# Patient Record
Sex: Female | Born: 1950 | Race: Black or African American | Hispanic: No | Smoking: Former smoker
Health system: Southern US, Community
[De-identification: ages and names within clinical notes are randomized; demographics above are authoritative.]

## PROBLEM LIST (undated history)

## (undated) DIAGNOSIS — J449 Chronic obstructive pulmonary disease, unspecified: Secondary | ICD-10-CM

## (undated) DIAGNOSIS — I2699 Other pulmonary embolism without acute cor pulmonale: Secondary | ICD-10-CM

## (undated) DIAGNOSIS — C801 Malignant (primary) neoplasm, unspecified: Secondary | ICD-10-CM

## (undated) HISTORY — PX: BREAST EXCISIONAL BIOPSY: SUR124

## (undated) HISTORY — DX: Malignant (primary) neoplasm, unspecified: C80.1

## (undated) HISTORY — DX: Other pulmonary embolism without acute cor pulmonale: I26.99

---

## 2015-09-22 DIAGNOSIS — E785 Hyperlipidemia, unspecified: Secondary | ICD-10-CM | POA: Insufficient documentation

## 2017-02-06 DIAGNOSIS — M810 Age-related osteoporosis without current pathological fracture: Secondary | ICD-10-CM | POA: Insufficient documentation

## 2017-12-05 ENCOUNTER — Encounter: Payer: Self-pay | Admitting: Physical Therapy

## 2017-12-05 ENCOUNTER — Other Ambulatory Visit: Payer: Self-pay

## 2017-12-05 ENCOUNTER — Ambulatory Visit: Payer: Medicare Other | Attending: Family Medicine | Admitting: Physical Therapy

## 2017-12-05 DIAGNOSIS — M546 Pain in thoracic spine: Secondary | ICD-10-CM

## 2017-12-05 DIAGNOSIS — M545 Low back pain, unspecified: Secondary | ICD-10-CM

## 2017-12-05 DIAGNOSIS — M6281 Muscle weakness (generalized): Secondary | ICD-10-CM

## 2017-12-05 DIAGNOSIS — M25512 Pain in left shoulder: Secondary | ICD-10-CM

## 2017-12-05 DIAGNOSIS — M25511 Pain in right shoulder: Secondary | ICD-10-CM | POA: Diagnosis present

## 2017-12-05 NOTE — Patient Instructions (Addendum)
Angry Cat, All Fours    Kneel on hands and knees. Tuck chin and tighten stomach. Exhale and round back upward. Inhale and arch back downward. Hold each position _1__ seconds. Repeat _10__ times per session. Do _1__ sessions per day.  Copyright  VHI. All rights reserved.  BACK: Child's Pose (Sciatica)    Sit in knee-chest position and reach arms forward. Separate knees for comfort. Hold position for _30__ breaths. Repeat __2_ times. Do _1__ times per day.  Copyright  VHI. All rights reserved.  Mid-Back Rotation Stretch    Reach to each side as far as possible, keeping chest low to floor. Hold _30___ seconds. Repeat __2__ times per set. Do __1__ sets per session. Do __1__ sessions per day.  http://orth.exer.us/132   Copyright  VHI. All rights reserved.  Lumbar Rotation (Sitting)    Arms crossed, gently rotate trunk from side to side in a small, pain-free range of motion. Repeat __4__ times per set. Do __1__ sets per session. Do __1__ sessions per day.  http://orth.exer.us/164   Copyright  VHI. All rights reserved.  [facility] [address], [place], [place] [address] Phone # [phone] Fax [phone]
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
## 2017-12-06 ENCOUNTER — Ambulatory Visit: Payer: Medicare Other | Admitting: Physical Therapy

## 2017-12-06 DIAGNOSIS — M545 Low back pain, unspecified: Secondary | ICD-10-CM

## 2017-12-06 DIAGNOSIS — M6281 Muscle weakness (generalized): Secondary | ICD-10-CM

## 2017-12-06 DIAGNOSIS — M546 Pain in thoracic spine: Secondary | ICD-10-CM | POA: Diagnosis not present

## 2017-12-06 DIAGNOSIS — M25511 Pain in right shoulder: Secondary | ICD-10-CM

## 2017-12-06 DIAGNOSIS — M25512 Pain in left shoulder: Secondary | ICD-10-CM

## 2017-12-06 NOTE — Patient Instructions (Addendum)
Copyright  VHI. All rights reserved.  Strengthening: Shoulder Shrug (Phase 1)   Shrug shoulders up and down, forward and backward. Repeat __10__ times per set. Do __1__ sets per session. Do _3___ sessions per day.  http://orth.exer.us/336   Copyright  VHI. All rights reserved.          TENS UNIT  This is helpful for muscle pain and spasm.   Search and Purchase a TENS 7000 2nd edition at www.tenspros.com or www.amazon.com  (It should be less than $30)     TENS unit instructions:   Do not shower or bathe with the unit on  Turn the unit off before removing electrodes or batteries  If the electrodes lose stickiness add a drop of water to the electrodes after they are disconnected from the unit and place on plastic sheet. If you continued to have difficulty, call the TENS unit company to purchase more electrodes.  Do not apply lotion on the skin area prior to use. Make sure the skin is clean and dry as this will help prolong the life of the electrodes.  After use, always check skin for unusual red areas, rash or other skin difficulties. If there are any skin problems, does not apply electrodes to the same area.  Never remove the electrodes from the unit by pulling the wires.  Do not use the TENS unit or electrodes other than as directed.  Do not change electrode placement without consulting your therapist or physician.  Keep 2 fingers with between each electrode.   TENS stands for Transcutaneous Electrical Nerve Stimulation. In other words, electrical impulses are allowed to pass through the skin in order to excite a nerve.   Purpose and Use of TENS:  TENS is a method used to manage acute and chronic pain without the use of drugs. It has been effective in managing pain associated with surgery, sprains, strains, trauma, rheumatoid arthritis, and neuralgias. It is a non-addictive, low risk, and non-invasive technique used to control pain. It is not,  by any means, a curative form of treatment.   How TENS Works:  Most TENS units are a Paramedic unit powered by one 9 volt battery. Attached to the outside of the unit are two lead wires where two pins and/or snaps connect on each wire. All units come with a set of four reusable pads or electrodes. These are placed on the skin surrounding the area involved. By inserting the leads into  the pads, the electricity can pass from the unit making the circuit complete.  As the intensity is turned up slowly, the electrical current enters the body from the electrodes through the skin to the surrounding nerve fibers. This triggers the release of hormones from within the body. These hormones contain pain relievers. By increasing the circulation of these hormones, the person's pain may be lessened. It is also believed that the electrical stimulation itself helps to block the pain messages being sent to the brain, thus also decreasing the body's perception of pain.   Hazards:  TENS units are NOT to be used by patients with PACEMAKERS, DEFIBRILLATORS, DIABETIC PUMPS, PREGNANT WOMEN, and patients with SEIZURE DISORDERS.  TENS units are NOT to be used over the heart, throat, brain, or spinal cord.  One of the major side effects from the TENS unit may be skin irritation. Some people may develop a rash if they are sensitive to the materials used in the electrodes  or the connecting wires.     Avoid overuse due the body getting used to the stem making it not as effective over time.  Ruben Im PT [facility] [address], [place], [place] [address] Phone # [phone] Fax [phone]
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
## 2017-12-25 ENCOUNTER — Ambulatory Visit: Payer: Medicare Other | Attending: Family Medicine | Admitting: Physical Therapy

## 2017-12-25 ENCOUNTER — Encounter: Payer: Self-pay | Admitting: Physical Therapy

## 2017-12-25 DIAGNOSIS — M546 Pain in thoracic spine: Secondary | ICD-10-CM | POA: Insufficient documentation

## 2017-12-25 DIAGNOSIS — M25511 Pain in right shoulder: Secondary | ICD-10-CM | POA: Diagnosis present

## 2017-12-25 DIAGNOSIS — M25512 Pain in left shoulder: Secondary | ICD-10-CM | POA: Diagnosis present

## 2017-12-25 DIAGNOSIS — M545 Low back pain, unspecified: Secondary | ICD-10-CM

## 2017-12-25 DIAGNOSIS — M6281 Muscle weakness (generalized): Secondary | ICD-10-CM

## 2017-12-25 NOTE — Patient Instructions (Addendum)
Isometric Hold (Hook-Lying)    Lie with hips and knees bent. Slowly inhale, and then exhale. Pull navel toward spine and Hold for _5__ seconds. Continue to breathe in and out during hold. Rest for __5_ seconds. Repeat _10__ times. Do _1__ times a day.   Copyright  VHI. All rights reserved.    Shoulder Press    Press both shoulders down. Hold _5 seconds. Repeat _10__ times.  Do other shoulder. If unable to press one or both shoulders, lie in position a few sessions until you can. Surface: floor   Copyright  VHI. All rights reserved.    In a side lying position have bottom leg straight and top knee bent as pictured. Rotation shoulders back to feel a stretch in the lumbar spine. Hold down on top knee as shown. Hold 30 seconds 2 time each way.   [facility] [address], [place], [place] [address] Phone # [phone] Fax [phone]
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
## 2017-12-27 ENCOUNTER — Ambulatory Visit: Payer: Medicare Other | Admitting: Physical Therapy

## 2017-12-27 DIAGNOSIS — M545 Low back pain, unspecified: Secondary | ICD-10-CM

## 2017-12-27 DIAGNOSIS — M6281 Muscle weakness (generalized): Secondary | ICD-10-CM

## 2017-12-27 DIAGNOSIS — M546 Pain in thoracic spine: Secondary | ICD-10-CM

## 2017-12-27 DIAGNOSIS — M25511 Pain in right shoulder: Secondary | ICD-10-CM

## 2017-12-27 DIAGNOSIS — M25512 Pain in left shoulder: Secondary | ICD-10-CM
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
## 2017-12-27 NOTE — Patient Instructions (Signed)
    RE-ALIGNMENT ROUTINE EXERCISES BASIC FOR POSTURAL CORRECTION   RE-ALIGNMENT Tips BENEFITS: 1.It helps to re-align the curves of the back and improve standing posture. 2.It allows the back muscles to rest and strengthen in preparation for more activity. FREQUENCY: Daily, even after weeks, months and years of more advanced exercises. START: 1.All exercises start in the same position: lying on the back, arms resting on the supporting surface, palms up and slightly away from the body, backs of hands down, knees bent, feet flat. 2.The head, neck, arms, and legs are supported according to specific instructions of your therapist. Copyright  VHI. All rights reserved.    1. Decompression Exercise: Basic.   Takes compression off the vertebral bodies; increases tolerance for lying on the back; helps relieve back pain   Lie on back on firm surface, knees bent, feet flat, arms turned up, out to sides (~35 degrees). Head neck and arms supported as necessary. Time _5-15__ minutes. Surface: floor     2. Shoulder Press  Strengthens upper back extensors and scapular retractors.   Press both shoulders down. Hold _2-3__ seconds. Repeat _3-5__ times. Surface: floor        3. Head Press With [place]  Strengthens neck extensors   Tuck chin SLIGHTLY toward chest, keep mouth closed. Feel weight on back of head. Increase weight by pressing head down. Hold _2-3__ seconds. Relax. Repeat 3-5___ times. Surface: floor   4. Leg Lengthener: Make your leg grow longer 5 sec hold, 5x   5. Whole leg push down "into the sand" 5 sec hold, 5x      Ruben Im PT [facility] [address] [place], [place] [address] Phone # [phone] Fax [phone]

## 2018-01-01 ENCOUNTER — Encounter: Payer: Self-pay | Admitting: Physical Therapy

## 2018-01-01 ENCOUNTER — Ambulatory Visit: Payer: Medicare Other | Admitting: Physical Therapy

## 2018-01-01 DIAGNOSIS — M546 Pain in thoracic spine: Secondary | ICD-10-CM | POA: Diagnosis not present

## 2018-01-01 DIAGNOSIS — M545 Low back pain, unspecified: Secondary | ICD-10-CM

## 2018-01-01 DIAGNOSIS — M25511 Pain in right shoulder: Secondary | ICD-10-CM

## 2018-01-01 DIAGNOSIS — M6281 Muscle weakness (generalized): Secondary | ICD-10-CM

## 2018-01-01 DIAGNOSIS — M25512 Pain in left shoulder: Secondary | ICD-10-CM
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
## 2018-01-01 NOTE — Patient Instructions (Signed)
   Over Head Pull: Narrow Grip       On back, knees bent, feet flat, band across thighs, elbows straight but relaxed. Pull hands apart (start). Keeping elbows straight, bring arms up and over head, hands toward floor. Keep pull steady on band. Hold momentarily. Return slowly, keeping pull steady, back to start. Repeat _5-8__ times. Band color ___yellow___   Side Pull: Double Arm   On back, knees bent, feet flat. Arms perpendicular to body, shoulder level, elbows straight but relaxed. Pull arms out to sides, elbows straight. Resistance band comes across collarbones, hands toward floor. Hold momentarily. Slowly return to starting position. Repeat _5-8__ times. Band color _yellow____   Sash   On back, knees bent, feet flat, left hand on left hip, right hand above left. Pull right arm DIAGONALLY (hip to shoulder) across chest. Bring right arm along head toward floor. Hold momentarily. Slowly return to starting position. Repeat __5-8_ times. Do with left arm. Band color ____yellow __   Shoulder Rotation: Double Arm   On back, knees bent, feet flat, elbows tucked at sides, bent 90, hands palms up. Pull hands apart and down toward floor, keeping elbows near sides. Hold momentarily. Slowly return to starting position. Repeat __5-8_ times. Band color ___yellow___    [facility] [address] [place], [place] [address] Phone # [phone] Fax [phone]

## 2018-01-03 ENCOUNTER — Encounter: Payer: Self-pay | Admitting: Physical Therapy

## 2018-01-03 ENCOUNTER — Ambulatory Visit: Payer: Medicare Other | Admitting: Physical Therapy

## 2018-01-03 DIAGNOSIS — M546 Pain in thoracic spine: Secondary | ICD-10-CM

## 2018-01-03 DIAGNOSIS — M25512 Pain in left shoulder: Secondary | ICD-10-CM

## 2018-01-03 DIAGNOSIS — M545 Low back pain, unspecified: Secondary | ICD-10-CM

## 2018-01-03 DIAGNOSIS — M25511 Pain in right shoulder: Secondary | ICD-10-CM

## 2018-01-03 DIAGNOSIS — M6281 Muscle weakness (generalized): Secondary | ICD-10-CM
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
## 2018-01-09 ENCOUNTER — Encounter: Payer: Self-pay | Admitting: Physical Therapy

## 2018-01-09 ENCOUNTER — Ambulatory Visit: Payer: Medicare Other | Admitting: Physical Therapy

## 2018-01-09 DIAGNOSIS — M546 Pain in thoracic spine: Secondary | ICD-10-CM

## 2018-01-09 DIAGNOSIS — M545 Low back pain, unspecified: Secondary | ICD-10-CM

## 2018-01-09 DIAGNOSIS — M6281 Muscle weakness (generalized): Secondary | ICD-10-CM

## 2018-01-09 DIAGNOSIS — M25511 Pain in right shoulder: Secondary | ICD-10-CM

## 2018-01-09 DIAGNOSIS — M25512 Pain in left shoulder: Secondary | ICD-10-CM
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
## 2018-01-17 ENCOUNTER — Encounter: Payer: Self-pay | Admitting: Physical Therapy

## 2018-01-17 ENCOUNTER — Ambulatory Visit: Payer: Medicare Other | Attending: Family Medicine | Admitting: Physical Therapy

## 2018-01-17 DIAGNOSIS — M25512 Pain in left shoulder: Secondary | ICD-10-CM | POA: Diagnosis present

## 2018-01-17 DIAGNOSIS — M545 Low back pain, unspecified: Secondary | ICD-10-CM

## 2018-01-17 DIAGNOSIS — M6281 Muscle weakness (generalized): Secondary | ICD-10-CM | POA: Insufficient documentation

## 2018-01-17 DIAGNOSIS — M546 Pain in thoracic spine: Secondary | ICD-10-CM | POA: Diagnosis not present

## 2018-01-17 DIAGNOSIS — M25511 Pain in right shoulder: Secondary | ICD-10-CM | POA: Insufficient documentation

## 2018-01-17 NOTE — Patient Instructions (Signed)
   Abdominal brace:  Exhale as you draw in your muscles    Abdominal brace with hand to opposite knee push 5 sec hold.  Don't hold breath           Ruben Im PT [facility] [address] [place], [place] [address] Phone # [phone] Fax [phone]
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
## 2018-07-17 ENCOUNTER — Other Ambulatory Visit: Payer: Self-pay | Admitting: Internal Medicine

## 2018-07-17 DIAGNOSIS — R634 Abnormal weight loss: Secondary | ICD-10-CM

## 2018-07-18 ENCOUNTER — Ambulatory Visit
Admission: RE | Admit: 2018-07-18 | Discharge: 2018-07-18 | Disposition: A | Discharge status: Home / Self Care | Payer: Medicare Other | Source: Ambulatory Visit | Attending: Internal Medicine | Admitting: Internal Medicine

## 2018-07-18 ENCOUNTER — Encounter: Payer: Self-pay | Admitting: Radiology

## 2018-07-18 DIAGNOSIS — R634 Abnormal weight loss: Secondary | ICD-10-CM

## 2018-07-18 MED ORDER — IOPAMIDOL (ISOVUE-300) INJECTION 61%
100.0000 mL | Freq: Once | INTRAVENOUS | Status: AC | PRN
  Administered 2018-07-18: 100 mL via INTRAVENOUS

## 2018-08-01 ENCOUNTER — Other Ambulatory Visit: Payer: Self-pay | Admitting: Gastroenterology

## 2018-08-01 DIAGNOSIS — R933 Abnormal findings on diagnostic imaging of other parts of digestive tract: Secondary | ICD-10-CM

## 2018-08-13 ENCOUNTER — Ambulatory Visit
Admission: RE | Admit: 2018-08-13 | Discharge: 2018-08-13 | Disposition: A | Discharge status: Home / Self Care | Payer: Medicare Other | Source: Ambulatory Visit | Attending: Gastroenterology | Admitting: Gastroenterology

## 2018-08-13 DIAGNOSIS — R933 Abnormal findings on diagnostic imaging of other parts of digestive tract: Secondary | ICD-10-CM

## 2018-08-13 MED ORDER — GADOBENATE DIMEGLUMINE 529 MG/ML IV SOLN
14.0000 mL | Freq: Once | INTRAVENOUS | Status: AC | PRN
  Administered 2018-08-13: 14 mL via INTRAVENOUS

## 2018-08-19 ENCOUNTER — Other Ambulatory Visit: Payer: Self-pay | Admitting: Gastroenterology

## 2018-08-22 ENCOUNTER — Encounter (HOSPITAL_COMMUNITY): Payer: Self-pay | Admitting: *Deleted

## 2018-08-23 ENCOUNTER — Ambulatory Visit (HOSPITAL_COMMUNITY)
Admission: RE | Admit: 2018-08-23 | Discharge: 2018-08-23 | Disposition: A | Discharge status: Home / Self Care | Payer: Medicare Other | Source: Ambulatory Visit | Attending: Gastroenterology | Admitting: Gastroenterology

## 2018-08-23 ENCOUNTER — Encounter (HOSPITAL_COMMUNITY): Payer: Self-pay | Admitting: *Deleted

## 2018-08-23 ENCOUNTER — Ambulatory Visit (HOSPITAL_COMMUNITY): Payer: Medicare Other | Admitting: Anesthesiology

## 2018-08-23 ENCOUNTER — Encounter (HOSPITAL_COMMUNITY)
Admission: RE | Disposition: A | Discharge status: Home / Self Care | Payer: Self-pay | Source: Ambulatory Visit | Attending: Gastroenterology

## 2018-08-23 ENCOUNTER — Other Ambulatory Visit: Payer: Self-pay

## 2018-08-23 DIAGNOSIS — K862 Cyst of pancreas: Secondary | ICD-10-CM | POA: Insufficient documentation

## 2018-08-23 DIAGNOSIS — Z87891 Personal history of nicotine dependence: Secondary | ICD-10-CM | POA: Diagnosis not present

## 2018-08-23 DIAGNOSIS — K219 Gastro-esophageal reflux disease without esophagitis: Secondary | ICD-10-CM | POA: Diagnosis not present

## 2018-08-23 DIAGNOSIS — Z79899 Other long term (current) drug therapy: Secondary | ICD-10-CM | POA: Diagnosis not present

## 2018-08-23 HISTORY — PX: UPPER ESOPHAGEAL ENDOSCOPIC ULTRASOUND (EUS): SHX6562

## 2018-08-23 HISTORY — PX: ESOPHAGOGASTRODUODENOSCOPY: SHX5428

## 2018-08-23 SURGERY — UPPER ESOPHAGEAL ENDOSCOPIC ULTRASOUND (EUS)
Anesthesia: Monitor Anesthesia Care

## 2018-08-23 MED ORDER — PROPOFOL 10 MG/ML IV BOLUS
INTRAVENOUS | Status: DC | PRN
  Administered 2018-08-23: 30 mg via INTRAVENOUS

## 2018-08-23 MED ORDER — PROPOFOL 500 MG/50ML IV EMUL
INTRAVENOUS | Status: DC | PRN
  Administered 2018-08-23: 125 ug/kg/min via INTRAVENOUS

## 2018-08-23 MED ORDER — LACTATED RINGERS IV SOLN
INTRAVENOUS | Status: DC
  Administered 2018-08-23: 1000 mL via INTRAVENOUS

## 2018-08-23 MED ORDER — PROMETHAZINE HCL 25 MG/ML IJ SOLN: 6.2500 mg | INTRAMUSCULAR | Status: DC | PRN

## 2018-08-23 MED ORDER — SODIUM CHLORIDE 0.9 % IV SOLN: INTRAVENOUS | Status: DC

## 2018-08-23 MED ORDER — LIDOCAINE 2% (20 MG/ML) 5 ML SYRINGE
INTRAMUSCULAR | Status: DC | PRN
  Administered 2018-08-23: 100 mg via INTRAVENOUS

## 2018-08-23 NOTE — Transfer of Care (Signed)
Immediate Anesthesia Transfer of Care Note  Patient: Beth Brock  Procedure(s) Performed: UPPER ESOPHAGEAL ENDOSCOPIC ULTRASOUND (EUS) (N/A ) FINE NEEDLE ASPIRATION (FNA) LINEAR (N/A )  Patient Location: PACU and Endoscopy Unit  Anesthesia Type:MAC  Level of Consciousness: awake, alert  and oriented  Airway & Oxygen Therapy: Patient Spontanous Breathing  Post-op Assessment: Report given to RN and Post -op Vital signs reviewed and stable  Post vital signs: Reviewed and stable  Last Vitals:  Vitals Value Taken Time  BP 129/73 08/23/2018  1:11 PM  Temp 36.3 C 08/23/2018  1:11 PM  Pulse 88 08/23/2018  1:11 PM  Resp 19 08/23/2018  1:13 PM  SpO2 95 % 08/23/2018  1:11 PM  Vitals shown include unvalidated device data.  Last Pain:  Vitals:   08/23/18 1311  TempSrc: Oral  PainSc: 0-No pain         Complications: No apparent anesthesia complications

## 2018-08-23 NOTE — H&P (Signed)
  Beth Brock HPI:  This 67 year old black female presents to the office for a follow up. She had to cancel her EGD and colonoscopy earlier this month, as hse had a flare up of her diverticulitis. She had a CT scan of the abdomen and pelvis done on 07/18/2018 which revealed mild wall thickening of the proximal sigmoid colon with adjacent stranding/inflammation likely representing acute diverticulitis; there was no evidence of abscess, bowel obstruction or pneumoperitoneum; also noted on the CT scan was an 8 mm pancreatic body cyst, an MRI/MRCP was recommended. She was given a 10 days course of Ciprofloxacin and Metronidazole. The LLQ pain is better but she is now having epigastric pain. She is currently taking Omeprazole for acid reflux. She has a poor appetite and has lost over 25 pounds since June, 2019. She has lost 7 pounds since her last visit here 05/2018. She has 1-2 loose BM's per day with no obvious blood or mucus in the stool. She denies any complaints of vomiting, dysphagia or odynophagia. She denies having a family history of colon cancer, celiac sprue or IBD. Her last colonoscopy was done on 07/08/2014 which revealed internal hemorrhoids, pandiverticulosis, more prominent in the sigmoid colon and a sessile serrated polyp at 50 cm was ablated.     History reviewed. No pertinent past medical history.  History reviewed. No pertinent surgical history.  History reviewed. No pertinent family history.  Social History:  has no tobacco, alcohol, and drug history on file.  Allergies: No Known Allergies  Medications:  Scheduled:  Continuous: . sodium chloride    . lactated ringers      No results found for this or any previous visit (from the past 24 hour(s)).   No results found.  ROS:  As stated above in the HPI otherwise negative.  There were no vitals taken for this visit.    PE: Gen: NAD, Alert and Oriented HEENT:  [place]/AT, EOMI Neck: Supple, no LAD Lungs: CTA  Bilaterally CV: RRR without M/G/R ABM: Soft, NTND, +BS Ext: No C/C/E  Assessment/Plan: 1) ? Pancreatic mass - EUS with FNA.  Beth Brock D 08/23/2018, 12:15 PM

## 2018-08-23 NOTE — Op Note (Signed)
[facility] Patient Name: Beth Brock Procedure Date: 08/23/2018 MRN: [id number] Attending MD: Carol Ada , MD Date of Birth: 10/06/1950 CSN: [id number] Age: 67 Admit Type: Outpatient Procedure:                Upper EUS Indications:              Pancreatic cyst on MRCP Providers:                Carol Ada, MD, Cleda Daub, RN, Cletis Athens,                            Technician, Stephanie [place] CRNA Referring MD:              Medicines:                Propofol per Anesthesia Complications:            No immediate complications. Estimated Blood Loss:     Estimated blood loss: none. Procedure:                Pre-Anesthesia Assessment:                           - Prior to the procedure, a History and Physical                            was performed, and patient medications and                            allergies were reviewed. The patient's tolerance of                            previous anesthesia was also reviewed. The risks                            and benefits of the procedure and the sedation                            options and risks were discussed with the patient.                            All questions were answered, and informed consent                            was obtained. Prior Anticoagulants: The patient has                            taken no previous anticoagulant or antiplatelet                            agents. ASA Grade Assessment: II - A patient with                            mild systemic disease. After reviewing the risks  and benefits, the patient was deemed in                            satisfactory condition to undergo the procedure.                           - Sedation was administered by an anesthesia                            professional. Deep sedation was attained.                           After obtaining informed consent, the endoscope was                            passed under direct  vision. Throughout the                            procedure, the patient's blood pressure, pulse, and                            oxygen saturations were monitored continuously. The                            [id number] ([id number] Olympus Linear EUS was                            introduced through the mouth, and advanced to the                            second part of duodenum. The upper EUS was                            accomplished without difficulty. The patient                            tolerated the procedure well. Scope In: Scope Out: Findings:      ENDOSONOGRAPHIC FINDING: :      Anechoic lesions suggestive of multiple cysts were identified in the       pancreatic head and pancreatic body. The largest lesion measured 16 mm       by 8 mm in maximal cross-sectional diameter. There was no associated       mass.      There was no sign of significant endosonographic abnormality in the       common bile duct. The maximum diameter of the duct was 6 mm.      Outside of the multicystic lesions in the head of the pancreas and the       larger 1.6 -1.8 cm body pancreatic cyst, the pancreatic parenchyma was       normal. Excellent views of the SMA were obtained, but the images were       not properly captured. However, the SMA was patent and there was no       evidence of any surrounding mass or other suspicious lesions. The Celiac  axis was also normal. There was no evidence of biliary ductal dilation       or pancreatic ductal dilation. Impression:               - Multiple cystic lesions were seen in the                            pancreatic head and pancreatic body. Tissue has not                            been obtained. However, the endosonographic                            appearance is of a benign (true) cyst.                           - There was no sign of significant pathology in the                            common bile duct.                           - No specimens  collected. Moderate Sedation:      Not Applicable - Patient had care per Anesthesia. Recommendation:           - Patient has a contact number available for                            emergencies. The signs and symptoms of potential                            delayed complications were discussed with the                            patient. Return to normal activities tomorrow.                            Written discharge instructions were provided to the                            patient.                           - Resume regular diet.                           - Return to GI office in 4 weeks.                           - Consider repeating the MRCP in 3-6 months. 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 All rights reserved. The codes documented in this report are preliminary and upon coder review may  be revised to meet current compliance requirements. Carol Ada, MD Carol Ada, MD 08/23/2018 1:17:08 PM This report has been signed electronically. Number of Addenda: 0

## 2018-08-23 NOTE — Anesthesia Postprocedure Evaluation (Signed)
Anesthesia Post Note  Patient: Dalylah E Jungbluth  Procedure(s) Performed: UPPER ESOPHAGEAL ENDOSCOPIC ULTRASOUND (EUS) (N/A ) FINE NEEDLE ASPIRATION (FNA) LINEAR (N/A )     Patient location during evaluation: PACU Anesthesia Type: MAC Level of consciousness: awake and alert Pain management: pain level controlled Vital Signs Assessment: post-procedure vital signs reviewed and stable Respiratory status: spontaneous breathing, nonlabored ventilation and respiratory function stable Cardiovascular status: blood pressure returned to baseline and stable Postop Assessment: no apparent nausea or vomiting Anesthetic complications: no    Last Vitals:  Vitals:   08/23/18 1227 08/23/18 1311  BP: 132/75 129/73  Pulse: 95 88  Resp: 10 17  Temp: 36.7 C (!) 36.3 C  SpO2: 97% 95%    Last Pain:  Vitals:   08/23/18 1311  TempSrc: Oral  PainSc: 0-No pain                 Brennan Bailey

## 2018-08-23 NOTE — Discharge Instructions (Signed)

## 2018-08-23 NOTE — Anesthesia Preprocedure Evaluation (Addendum)
Anesthesia Evaluation  Patient identified by MRN, date of birth, ID band Patient awake    Reviewed: Allergy & Precautions, NPO status , Patient's Chart, lab work & pertinent test results  History of Anesthesia Complications Negative for: history of anesthetic complications  Airway Mallampati: II  TM Distance: >3 FB Neck ROM: Full    Dental  (+) Dental Advisory Given, Partial Lower   Pulmonary former smoker,    Pulmonary exam normal breath sounds clear to auscultation       Cardiovascular negative cardio ROS Normal cardiovascular exam Rhythm:Regular Rate:Normal     Neuro/Psych negative neurological ROS     GI/Hepatic Neg liver ROS, GERD  Medicated,Pancreatic mass   Endo/Other  negative endocrine ROS  Renal/GU negative Renal ROS     Musculoskeletal negative musculoskeletal ROS (+)   Abdominal   Peds  Hematology negative hematology ROS (+)   Anesthesia Other Findings Day of surgery medications reviewed with the patient.  Reproductive/Obstetrics                            Anesthesia Physical Anesthesia Plan  ASA: II  Anesthesia Plan: MAC   Post-op Pain Management:    Induction:   PONV Risk Score and Plan: 2 and Treatment may vary due to age or medical condition and Propofol infusion  Airway Management Planned: Natural Airway and Nasal Cannula  Additional Equipment:   Intra-op Plan:   Post-operative Plan:   Informed Consent: I have reviewed the patients History and Physical, chart, labs and discussed the procedure including the risks, benefits and alternatives for the proposed anesthesia with the patient or authorized representative who has indicated his/her understanding and acceptance.   Dental advisory given  Plan Discussed with: CRNA  Anesthesia Plan Comments:        Anesthesia Quick Evaluation

## 2018-08-26 ENCOUNTER — Encounter (HOSPITAL_COMMUNITY): Payer: Self-pay | Admitting: Gastroenterology

## 2018-09-04 DIAGNOSIS — K862 Cyst of pancreas: Secondary | ICD-10-CM | POA: Insufficient documentation

## 2020-01-08 ENCOUNTER — Other Ambulatory Visit: Payer: Self-pay | Admitting: Internal Medicine

## 2020-01-08 DIAGNOSIS — Z13820 Encounter for screening for osteoporosis: Secondary | ICD-10-CM

## 2020-01-08 DIAGNOSIS — Z1231 Encounter for screening mammogram for malignant neoplasm of breast: Secondary | ICD-10-CM

## 2020-02-03 DIAGNOSIS — C250 Malignant neoplasm of head of pancreas: Secondary | ICD-10-CM | POA: Diagnosis present

## 2020-03-02 ENCOUNTER — Emergency Department (HOSPITAL_COMMUNITY): Payer: Medicare HMO

## 2020-03-02 ENCOUNTER — Emergency Department (HOSPITAL_COMMUNITY)
Admission: EM | Admit: 2020-03-02 | Discharge: 2020-03-02 | Disposition: A | Discharge status: Home / Self Care | Payer: Medicare HMO | Attending: Emergency Medicine | Admitting: Emergency Medicine

## 2020-03-02 ENCOUNTER — Encounter (HOSPITAL_COMMUNITY): Payer: Self-pay | Admitting: Emergency Medicine

## 2020-03-02 ENCOUNTER — Other Ambulatory Visit: Payer: Self-pay

## 2020-03-02 DIAGNOSIS — Y998 Other external cause status: Secondary | ICD-10-CM | POA: Diagnosis not present

## 2020-03-02 DIAGNOSIS — W272XXA Contact with scissors, initial encounter: Secondary | ICD-10-CM | POA: Insufficient documentation

## 2020-03-02 DIAGNOSIS — Z87891 Personal history of nicotine dependence: Secondary | ICD-10-CM | POA: Diagnosis not present

## 2020-03-02 DIAGNOSIS — Y9289 Other specified places as the place of occurrence of the external cause: Secondary | ICD-10-CM | POA: Insufficient documentation

## 2020-03-02 DIAGNOSIS — S61412A Laceration without foreign body of left hand, initial encounter: Secondary | ICD-10-CM | POA: Insufficient documentation

## 2020-03-02 DIAGNOSIS — Y9389 Activity, other specified: Secondary | ICD-10-CM | POA: Diagnosis not present

## 2020-03-02 MED ORDER — CEPHALEXIN 500 MG PO CAPS
500.0000 mg | ORAL_CAPSULE | Freq: Two times a day (BID) | ORAL | 0 refills | Status: AC
Start: 2020-03-02 — End: 2020-03-07

## 2020-03-02 NOTE — ED Notes (Signed)
Patient reports cutting L palm with scissors while opening a box approx 1 hour ago. Bleeding controlled. Pt currently on chemotherapy

## 2020-03-02 NOTE — ED Provider Notes (Signed)
[facility] DEPT Provider Note   CSN: [id number] Arrival date & time: 03/02/20  2027     History Chief Complaint  Patient presents with  . Extremity Laceration    Beth Brock is a 69 y.o. female.  69 year old female with past medical history including pancreatic cancer on chemotherapy who presents with left hand laceration.  Just prior to arrival, the patient was cutting with scissors when she accidentally stabbed her left palm with the scissor tips.  She had a lot of bleeding initially but it has slowed down.  She has had some tingling and pain shooting up her Anis Cinelli finger and down into her forearm.  No anticoagulant use.  She states tetanus is up-to-date.  The history is provided by the patient.       History reviewed. No pertinent past medical history.  There are no problems to display for this patient.   Past Surgical History:  Procedure Laterality Date  . ESOPHAGOGASTRODUODENOSCOPY N/A 08/23/2018   Procedure: ESOPHAGOGASTRODUODENOSCOPY (EGD);  Surgeon: Carol Ada, MD;  Location: [facility];  Service: Endoscopy;  Laterality: N/A;  . UPPER ESOPHAGEAL ENDOSCOPIC ULTRASOUND (EUS) N/A 08/23/2018   Procedure: UPPER ESOPHAGEAL ENDOSCOPIC ULTRASOUND (EUS);  Surgeon: Carol Ada, MD;  Location: [facility];  Service: Endoscopy;  Laterality: N/A;     OB History   No obstetric history on file.     History reviewed. No pertinent family history.  Social History   Tobacco Use  . Smoking status: Former Smoker    Quit date: 08/23/2005    Years since quitting: 14.5  . Smokeless tobacco: Never Used  Vaping Use  . Vaping Use: Never used  Substance Use Topics  . Alcohol use: Yes    Comment: wine with dinner  . Drug use: Not Currently    Home Medications Prior to Admission medications   Medication Sig Start Date End Date Taking? Authorizing Provider  cephALEXin (KEFLEX) 500 MG capsule Take 1 capsule (500 mg total) by mouth 2 (two)  times daily for 5 days. 03/02/20 03/07/20  [place] Fant, Wenda Overland, MD  Multiple Vitamin (MULTIVITAMIN WITH MINERALS) TABS tablet Take 1 tablet by mouth daily. Multivitamin for Adults 50+    [provider]  Noni, Morinda citrifolia, (NONI JUICE PO) Take 30 mLs by mouth daily.    [provider]  omeprazole (PRILOSEC) 20 MG capsule Take 20 mg by mouth daily at 3 pm.    [provider]  omeprazole (PRILOSEC) 40 MG capsule Take 40 mg by mouth daily before breakfast.    [provider]  Polyethyl Glycol-Propyl Glycol (LUBRICANT EYE DROPS) 0.4-0.3 % SOLN Place 1 drop into both eyes 3 (three) times daily as needed (for dry/irritated eyes.).    [provider]  TURMERIC PO Take 1 capsule by mouth 4 (four) times a week.    [provider]    Allergies    Patient has no known allergies.  Review of Systems   Review of Systems  Constitutional: Negative for fever.  Skin: Positive for wound.  Hematological: Does not bruise/bleed easily.    Physical Exam Updated Vital Signs BP 133/79 (BP Location: Right Arm)   Pulse 89   Temp 98.3 F (36.8 C) (Oral)   Resp 18   Ht 5\' 7"  (1.702 m)   Wt 61.7 kg   SpO2 95%   BMI 21.30 kg/m   Physical Exam Vitals and nursing note reviewed.  Constitutional:      General: She is not in  acute distress.    Appearance: She is well-developed.  HENT:     Head: Normocephalic and atraumatic.  Eyes:     Conjunctiva/sclera: Conjunctivae normal.  Cardiovascular:     Pulses: Normal pulses.  Musculoskeletal:     Cervical back: Neck supple.  Skin:    General: Skin is warm and dry.     Comments: 0.5cm stab laceration on ulnar side of left palm with associated edema and tenderness, slight bleeding from wound  Neurological:     Mental Status: She is alert and oriented to person, place, and time.     Sensory: No sensory deficit.  Psychiatric:        Judgment: Judgment normal.     ED Results / Procedures /  Treatments   Labs (all labs ordered are listed, but only abnormal results are displayed) Labs Reviewed - No data to display  EKG None  Radiology DG Hand Complete Left  Result Date: 03/02/2020 CLINICAL DATA:  Laceration palmar aspect left hand 1 hour ago EXAM: LEFT HAND - COMPLETE 3+ VIEW COMPARISON:  None. FINDINGS: Frontal, oblique, lateral views of the left hand demonstrate no fractures. No radiopaque foreign bodies. Alignment is anatomic. 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 IMPRESSION: 1. No fracture or radiopaque foreign body. 2. Mild osteoarthritis. Electronically Signed   By: Randa Ngo M.D.   On: 03/02/2020 22:57    Procedures .Marland KitchenLaceration Repair  Date/Time: 03/02/2020 11:18 PM Performed by: Sharlett Iles, MD Authorized by: Sharlett Iles, MD   Consent:    Consent obtained:  Verbal   Consent given by:  Patient   Risks discussed:  Infection, pain, poor cosmetic result and poor wound healing Anesthesia (see MAR for exact dosages):    Anesthesia method:  None Laceration details:    Location:  Hand   Hand location:  L palm   Length (cm):  0.5 Repair type:    Repair type:  Simple Treatment:    Area cleansed with:  Betadine and saline   Amount of cleaning:  Standard   Irrigation solution:  Sterile saline Skin repair:    Repair method: Stericlip. Approximation:    Approximation:  Close Post-procedure details:    Dressing:  Non-adherent dressing   Patient tolerance of procedure:  Tolerated well, no immediate complications   (including critical care time)  Medications Ordered in ED Medications - No data to display  ED Course  I have reviewed the triage vital signs and the nursing notes.  Pertinent  imaging results that were available during my care of the patient were reviewed by me and considered in my medical decision making (see chart for details).    MDM Rules/Calculators/A&P                          XR negative for  fracture. Because it was a puncture/stab wound and edges well approximated, I recommended not suturing to avoid increasing her infection risk. Irrigated and applied stericlip.  Discussed wound management occluding keeping wound clean and dry.  Because she is immunosuppressed, will give short course of antibiotics.  I have extensively reviewed return precautions regarding any signs of infection instructed to report directly back to the ER.  She voiced understanding. Final Clinical Impression(s) / ED Diagnoses Final diagnoses:  Stab wound of left hand, initial encounter    Rx / DC Orders ED Discharge Orders         Ordered    cephALEXin (KEFLEX) 500  MG capsule  2 times daily     Discontinue  Reprint     03/02/20 2312           Shanoah Asbill, Wenda Overland, MD 03/02/20 [phone]

## 2020-03-19 ENCOUNTER — Ambulatory Visit
Admission: RE | Admit: 2020-03-19 | Discharge: 2020-03-19 | Disposition: A | Discharge status: Home / Self Care | Payer: Medicare HMO | Source: Ambulatory Visit | Attending: Internal Medicine | Admitting: Internal Medicine

## 2020-03-19 ENCOUNTER — Ambulatory Visit
Admission: RE | Admit: 2020-03-19 | Discharge: 2020-03-19 | Disposition: A | Discharge status: Home / Self Care | Payer: Medicare Other | Source: Ambulatory Visit | Attending: Internal Medicine | Admitting: Internal Medicine

## 2020-03-19 ENCOUNTER — Other Ambulatory Visit: Payer: Self-pay

## 2020-03-19 DIAGNOSIS — Z13820 Encounter for screening for osteoporosis: Secondary | ICD-10-CM

## 2020-03-19 DIAGNOSIS — Z1231 Encounter for screening mammogram for malignant neoplasm of breast: Secondary | ICD-10-CM

## 2020-06-14 ENCOUNTER — Inpatient Hospital Stay (HOSPITAL_COMMUNITY)
Admission: EM | Admit: 2020-06-14 | Discharge: 2020-06-19 | DRG: 987 | Disposition: A | Discharge status: Home / Self Care | Payer: Medicare HMO | Attending: Student in an Organized Health Care Education/Training Program | Admitting: Student in an Organized Health Care Education/Training Program

## 2020-06-14 ENCOUNTER — Emergency Department (HOSPITAL_COMMUNITY): Payer: Medicare HMO

## 2020-06-14 DIAGNOSIS — K55069 Acute infarction of intestine, part and extent unspecified: Secondary | ICD-10-CM | POA: Diagnosis present

## 2020-06-14 DIAGNOSIS — R310 Gross hematuria: Secondary | ICD-10-CM | POA: Diagnosis present

## 2020-06-14 DIAGNOSIS — R109 Unspecified abdominal pain: Secondary | ICD-10-CM

## 2020-06-14 DIAGNOSIS — Z888 Allergy status to other drugs, medicaments and biological substances status: Secondary | ICD-10-CM

## 2020-06-14 DIAGNOSIS — C259 Malignant neoplasm of pancreas, unspecified: Secondary | ICD-10-CM | POA: Diagnosis present

## 2020-06-14 DIAGNOSIS — I82C11 Acute embolism and thrombosis of right internal jugular vein: Secondary | ICD-10-CM | POA: Diagnosis present

## 2020-06-14 DIAGNOSIS — J449 Chronic obstructive pulmonary disease, unspecified: Secondary | ICD-10-CM | POA: Diagnosis present

## 2020-06-14 DIAGNOSIS — Z79899 Other long term (current) drug therapy: Secondary | ICD-10-CM

## 2020-06-14 DIAGNOSIS — C250 Malignant neoplasm of head of pancreas: Secondary | ICD-10-CM | POA: Diagnosis present

## 2020-06-14 DIAGNOSIS — Z7901 Long term (current) use of anticoagulants: Secondary | ICD-10-CM

## 2020-06-14 DIAGNOSIS — Z452 Encounter for adjustment and management of vascular access device: Secondary | ICD-10-CM

## 2020-06-14 DIAGNOSIS — I871 Compression of vein: Principal | ICD-10-CM | POA: Diagnosis present

## 2020-06-14 DIAGNOSIS — M810 Age-related osteoporosis without current pathological fracture: Secondary | ICD-10-CM | POA: Diagnosis present

## 2020-06-14 DIAGNOSIS — I2699 Other pulmonary embolism without acute cor pulmonale: Secondary | ICD-10-CM | POA: Diagnosis present

## 2020-06-14 DIAGNOSIS — E785 Hyperlipidemia, unspecified: Secondary | ICD-10-CM | POA: Diagnosis present

## 2020-06-14 DIAGNOSIS — Z419 Encounter for procedure for purposes other than remedying health state, unspecified: Secondary | ICD-10-CM

## 2020-06-14 DIAGNOSIS — Z20822 Contact with and (suspected) exposure to covid-19: Secondary | ICD-10-CM | POA: Diagnosis present

## 2020-06-14 DIAGNOSIS — Z91041 Radiographic dye allergy status: Secondary | ICD-10-CM

## 2020-06-14 DIAGNOSIS — N131 Hydronephrosis with ureteral stricture, not elsewhere classified: Secondary | ICD-10-CM | POA: Diagnosis present

## 2020-06-14 DIAGNOSIS — Z807 Family history of other malignant neoplasms of lymphoid, hematopoietic and related tissues: Secondary | ICD-10-CM

## 2020-06-14 DIAGNOSIS — Z87891 Personal history of nicotine dependence: Secondary | ICD-10-CM

## 2020-06-14 DIAGNOSIS — K5900 Constipation, unspecified: Secondary | ICD-10-CM | POA: Diagnosis present

## 2020-06-14 DIAGNOSIS — N1330 Unspecified hydronephrosis: Secondary | ICD-10-CM

## 2020-06-14 HISTORY — DX: Malignant (primary) neoplasm, unspecified: C80.1

## 2020-06-14 HISTORY — DX: Other pulmonary embolism without acute cor pulmonale: I26.99

## 2020-06-14 LAB — CBC WITH DIFFERENTIAL/PLATELET
Abs Immature Granulocytes: 0.03 10*3/uL (ref 0.00–0.07)
Basophils Absolute: 0 10*3/uL (ref 0.0–0.1)
Basophils Relative: 1 %
Eosinophils Absolute: 0 10*3/uL (ref 0.0–0.5)
Eosinophils Relative: 1 %
HCT: 40 % (ref 36.0–46.0)
Hemoglobin: 12.7 g/dL (ref 12.0–15.0)
Immature Granulocytes: 0 %
Lymphocytes Relative: 15 %
Lymphs Abs: 1.2 10*3/uL (ref 0.7–4.0)
MCH: 28.9 pg (ref 26.0–34.0)
MCHC: 31.8 g/dL (ref 30.0–36.0)
MCV: 90.9 fL (ref 80.0–100.0)
Monocytes Absolute: 0.9 10*3/uL (ref 0.1–1.0)
Monocytes Relative: 11 %
Neutro Abs: 6 10*3/uL (ref 1.7–7.7)
Neutrophils Relative %: 72 %
Platelets: 171 10*3/uL (ref 150–400)
RBC: 4.4 MIL/uL (ref 3.87–5.11)
RDW: 13.8 % (ref 11.5–15.5)
WBC: 8.2 10*3/uL (ref 4.0–10.5)
nRBC: 0 % (ref 0.0–0.2)

## 2020-06-14 LAB — URINALYSIS, ROUTINE W REFLEX MICROSCOPIC
Bilirubin Urine: NEGATIVE
Glucose, UA: NEGATIVE mg/dL
Ketones, ur: NEGATIVE mg/dL
Nitrite: NEGATIVE
Protein, ur: 100 mg/dL — AB
Specific Gravity, Urine: <1.005 — ABNORMAL LOW (ref 1.005–1.030)
pH: 5.5 (ref 5.0–8.0)

## 2020-06-14 LAB — COMPREHENSIVE METABOLIC PANEL
ALT: 21 U/L (ref 0–44)
AST: 20 U/L (ref 15–41)
Albumin: 3.8 g/dL (ref 3.5–5.0)
Alkaline Phosphatase: 61 U/L (ref 38–126)
Anion gap: 11 (ref 5–15)
BUN: 16 mg/dL (ref 8–23)
CO2: 24 mmol/L (ref 22–32)
Calcium: 10.4 mg/dL — ABNORMAL HIGH (ref 8.9–10.3)
Chloride: 104 mmol/L (ref 98–111)
Creatinine, Ser: 1 mg/dL (ref 0.44–1.00)
GFR calc Af Amer: >60 mL/min (ref >60)
GFR calc non Af Amer: 57 mL/min — ABNORMAL LOW (ref >60)
Glucose, Bld: 103 mg/dL — ABNORMAL HIGH (ref 70–99)
Potassium: 4.1 mmol/L (ref 3.5–5.1)
Sodium: 139 mmol/L (ref 135–145)
Total Bilirubin: 0.9 mg/dL (ref 0.3–1.2)
Total Protein: 7.3 g/dL (ref 6.5–8.1)

## 2020-06-14 LAB — URINALYSIS, MICROSCOPIC (REFLEX): RBC / HPF: >50 RBC/hpf (ref 0–5)

## 2020-06-14 LAB — LIPASE, BLOOD: Lipase: 19 U/L (ref 11–51)

## 2020-06-14 LAB — TROPONIN I (HIGH SENSITIVITY): Troponin I (High Sensitivity): 6 ng/L (ref <18)

## 2020-06-14 NOTE — ED Triage Notes (Signed)
Pt here from home sent by her MD to have her kidney function , pt has also had some left arm pain and abd pain , pt is currently being treated for pancreatic CA

## 2020-06-14 NOTE — ED Notes (Signed)
[phone], [place] Daughter would like an update

## 2020-06-15 ENCOUNTER — Other Ambulatory Visit: Payer: Self-pay

## 2020-06-15 ENCOUNTER — Encounter (HOSPITAL_COMMUNITY): Payer: Self-pay | Admitting: Student in an Organized Health Care Education/Training Program

## 2020-06-15 ENCOUNTER — Emergency Department (HOSPITAL_COMMUNITY): Payer: Medicare HMO

## 2020-06-15 DIAGNOSIS — K55069 Acute infarction of intestine, part and extent unspecified: Secondary | ICD-10-CM | POA: Diagnosis present

## 2020-06-15 DIAGNOSIS — C250 Malignant neoplasm of head of pancreas: Secondary | ICD-10-CM | POA: Diagnosis present

## 2020-06-15 DIAGNOSIS — Z91041 Radiographic dye allergy status: Secondary | ICD-10-CM | POA: Diagnosis not present

## 2020-06-15 DIAGNOSIS — M810 Age-related osteoporosis without current pathological fracture: Secondary | ICD-10-CM | POA: Diagnosis present

## 2020-06-15 DIAGNOSIS — I2699 Other pulmonary embolism without acute cor pulmonale: Secondary | ICD-10-CM | POA: Diagnosis present

## 2020-06-15 DIAGNOSIS — Z79899 Other long term (current) drug therapy: Secondary | ICD-10-CM | POA: Diagnosis not present

## 2020-06-15 DIAGNOSIS — N131 Hydronephrosis with ureteral stricture, not elsewhere classified: Secondary | ICD-10-CM | POA: Diagnosis present

## 2020-06-15 DIAGNOSIS — R319 Hematuria, unspecified: Secondary | ICD-10-CM | POA: Diagnosis not present

## 2020-06-15 DIAGNOSIS — K5900 Constipation, unspecified: Secondary | ICD-10-CM | POA: Diagnosis present

## 2020-06-15 DIAGNOSIS — I871 Compression of vein: Secondary | ICD-10-CM | POA: Diagnosis present

## 2020-06-15 DIAGNOSIS — Z7901 Long term (current) use of anticoagulants: Secondary | ICD-10-CM | POA: Diagnosis not present

## 2020-06-15 DIAGNOSIS — R310 Gross hematuria: Secondary | ICD-10-CM | POA: Diagnosis present

## 2020-06-15 DIAGNOSIS — E785 Hyperlipidemia, unspecified: Secondary | ICD-10-CM | POA: Diagnosis present

## 2020-06-15 DIAGNOSIS — Z20822 Contact with and (suspected) exposure to covid-19: Secondary | ICD-10-CM | POA: Diagnosis present

## 2020-06-15 DIAGNOSIS — Z807 Family history of other malignant neoplasms of lymphoid, hematopoietic and related tissues: Secondary | ICD-10-CM | POA: Diagnosis not present

## 2020-06-15 DIAGNOSIS — Z888 Allergy status to other drugs, medicaments and biological substances status: Secondary | ICD-10-CM | POA: Diagnosis not present

## 2020-06-15 DIAGNOSIS — J449 Chronic obstructive pulmonary disease, unspecified: Secondary | ICD-10-CM | POA: Diagnosis present

## 2020-06-15 DIAGNOSIS — K55059 Acute (reversible) ischemia of intestine, part and extent unspecified: Secondary | ICD-10-CM | POA: Diagnosis not present

## 2020-06-15 DIAGNOSIS — I82C11 Acute embolism and thrombosis of right internal jugular vein: Secondary | ICD-10-CM | POA: Diagnosis present

## 2020-06-15 DIAGNOSIS — Z87891 Personal history of nicotine dependence: Secondary | ICD-10-CM | POA: Diagnosis not present

## 2020-06-15 DIAGNOSIS — C259 Malignant neoplasm of pancreas, unspecified: Secondary | ICD-10-CM | POA: Diagnosis present

## 2020-06-15 DIAGNOSIS — N1330 Unspecified hydronephrosis: Secondary | ICD-10-CM | POA: Diagnosis not present

## 2020-06-15 LAB — RESPIRATORY PANEL BY RT PCR (FLU A&B, COVID)
Influenza A by PCR: NEGATIVE
Influenza B by PCR: NEGATIVE
SARS Coronavirus 2 by RT PCR: NEGATIVE

## 2020-06-15 LAB — URINE CULTURE: Culture: <10000 — AB

## 2020-06-15 LAB — TROPONIN I (HIGH SENSITIVITY): Troponin I (High Sensitivity): 6 ng/L (ref <18)

## 2020-06-15 MED ORDER — FENTANYL CITRATE (PF) 100 MCG/2ML IJ SOLN: 50.0000 ug | Freq: Once | INTRAMUSCULAR | Status: DC

## 2020-06-15 MED ORDER — PANTOPRAZOLE SODIUM 40 MG PO TBEC
80.0000 mg | DELAYED_RELEASE_TABLET | Freq: Every day | ORAL | Status: DC
  Administered 2020-06-15 – 2020-06-19 (×3): 80 mg via ORAL
  Filled 2020-06-15 (×3): qty 2

## 2020-06-15 MED ORDER — OXYCODONE-ACETAMINOPHEN 5-325 MG PO TABS
1.0000 | ORAL_TABLET | Freq: Once | ORAL | Status: AC
  Administered 2020-06-15: 1 via ORAL
  Filled 2020-06-15: qty 1

## 2020-06-15 MED ORDER — IOHEXOL 300 MG/ML  SOLN
100.0000 mL | Freq: Once | INTRAMUSCULAR | Status: AC | PRN
  Administered 2020-06-15: 100 mL via INTRAVENOUS

## 2020-06-15 MED ORDER — PANCRELIPASE (LIP-PROT-AMYL) 12000-38000 UNITS PO CPEP
24000.0000 [IU] | ORAL_CAPSULE | Freq: Three times a day (TID) | ORAL | Status: DC
  Administered 2020-06-16 – 2020-06-19 (×4): 24000 [IU] via ORAL
  Filled 2020-06-15 (×13): qty 2

## 2020-06-15 MED ORDER — ALBUTEROL SULFATE HFA 108 (90 BASE) MCG/ACT IN AERS
1.0000 | INHALATION_SPRAY | RESPIRATORY_TRACT | Status: DC | PRN
  Filled 2020-06-15: qty 6.7

## 2020-06-15 MED ORDER — HEPARIN SODIUM (PORCINE) 5000 UNIT/ML IJ SOLN: 5000.0000 [IU] | Freq: Three times a day (TID) | INTRAMUSCULAR | Status: DC

## 2020-06-15 MED ORDER — ONDANSETRON 4 MG PO TBDP
4.0000 mg | ORAL_TABLET | Freq: Once | ORAL | Status: AC
  Administered 2020-06-15: 4 mg via ORAL
  Filled 2020-06-15: qty 1

## 2020-06-15 MED ORDER — ONDANSETRON 4 MG PO TBDP
4.0000 mg | ORAL_TABLET | Freq: Three times a day (TID) | ORAL | Status: DC | PRN
  Administered 2020-06-15: 4 mg via ORAL
  Filled 2020-06-15: qty 1

## 2020-06-15 MED ORDER — ENOXAPARIN SODIUM 60 MG/0.6ML ~~LOC~~ SOLN: 60.0000 mg | Freq: Two times a day (BID) | SUBCUTANEOUS | Status: DC

## 2020-06-15 MED ORDER — SODIUM CHLORIDE 0.9 % IV SOLN
1.0000 g | Freq: Once | INTRAVENOUS | Status: AC
  Administered 2020-06-15: 1 g via INTRAVENOUS
  Filled 2020-06-15: qty 10

## 2020-06-15 MED ORDER — ENOXAPARIN SODIUM 100 MG/ML ~~LOC~~ SOLN
90.0000 mg | SUBCUTANEOUS | Status: DC
  Administered 2020-06-15 – 2020-06-16 (×2): 90 mg via SUBCUTANEOUS
  Filled 2020-06-15 (×3): qty 1

## 2020-06-15 NOTE — Progress Notes (Signed)
ANTICOAGULATION CONSULT NOTE - Follow Up Consult  Pharmacy Consult for Xarelto to Lovenox Indication: PE history / SVC  Allergies  Allergen Reactions  . Eliquis [Apixaban] Swelling    Face swelling   . Iodinated Diagnostic Agents Itching    Patient Measurements: Height: 5\' 7"  (170.2 cm) Weight: 60.3 kg (133 lb) IBW/kg (Calculated) : 61.6  Vital Signs: Temp: 98 F (36.7 C) (09/28 1254) Temp Source: Oral (09/28 1254) BP: 119/73 (09/28 1254) Pulse Rate: 97 (09/28 1254)  Labs: Recent Labs    06/14/20 1333 06/14/20 1520  HGB 12.7  --   HCT 40.0  --   PLT 171  --   CREATININE 1.00  --   TROPONINIHS 6 6    Estimated Creatinine Clearance: 49.1 mL/min (by C-G formula based on SCr of 1 mg/dL).   Assessment: 69 year old female with history of pancreatic cancer and history of PE.  Developed possible allergic reaction to Eliquis and switched to Xarelto.  Switching now to Lovenox to ensure she is not having ongoing thrombosis with NOAC treatment.  CrCl > 30 ml/ min Last dose of Xarelto 48 hours ago  Goal of Therapy:  Anti-Xa level 0.6-1 units/ml 4hrs after LMWH dose given Monitor platelets by anticoagulation protocol: Yes   Plan:  Lovenox 90 mg sq Q 24 hours (1.5 mg / kg sq Q 24) Follow CBC  Thank you Anette Guarneri, PharmD  06/15/2020,2:42 PM

## 2020-06-15 NOTE — Consult Note (Addendum)
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 female, with a known history of pancreatic cancer.  She has previously received all of her cancer therapy at [facility].  She has primarily had chemotherapy.  Several weeks ago she was noted on what sounds like routine CT scan at [facility] to have had a pulmonary embolus.  She has a right-sided Port-A-Cath.  I suspect this was the nidus of her pulmonary embolus although the notes and images are not available for review today.  She was asymptomatic from this.  She did not have any shortness of breath chest pain or hemoptysis.  She was placed on Eliquis at that time.  A few days after this she experienced facial swelling bilateral eye swelling and bilateral upper extremity swelling.  She did not have any swelling or hives on her body.  She did not have any lower extremity swelling.  It was thought that potentially she was having an allergic reaction to Eliquis and she was switched to Xarelto.  Since that time she has not really had much facial swelling but has developed a very prominent veins on her neck and chest.  She has not had her Xarelto in almost 48 hours due to recent hospitalization.  She has never had a prior DVT.  She also has had some mild left upper and lower quadrant abdominal pain.  This has been intermittent in nature.  She has also had some constipation recently.  She currently does not have abdominal pain.  She states she has recently been on a chemotherapy holiday but has follow-up scheduled with her cancer doctor on October 5 at [facility].  She has not had any other central vein instrumentation that she can remember other than her right-sided Port-A-Cath.  Past Medical History:  Diagnosis Date   Cancer [facility])    Pancreatic    Pulmonary  embolism [facility])    Past Surgical History:  Procedure Laterality Date   BREAST EXCISIONAL BIOPSY Left    BREAST EXCISIONAL BIOPSY Left    BREAST EXCISIONAL BIOPSY Right    ESOPHAGOGASTRODUODENOSCOPY N/A 08/23/2018   Procedure: ESOPHAGOGASTRODUODENOSCOPY (EGD);  Surgeon: Carol Ada, MD;  Location: [facility];  Service: Endoscopy;  Laterality: N/A;   UPPER ESOPHAGEAL ENDOSCOPIC ULTRASOUND (EUS) N/A 08/23/2018   Procedure: UPPER ESOPHAGEAL ENDOSCOPIC ULTRASOUND (EUS);  Surgeon: Carol Ada, MD;  Location: [facility];  Service: Endoscopy;  Laterality: N/A;    Family History  Problem Relation Age of Onset   Breast cancer Mother 13   Breast cancer Paternal Grandmother 71    SOCIAL HISTORY: Social History   Socioeconomic History   Marital status: Divorced    Spouse name: Not on file   Number of children: Not on file   Years of education: Not on file   Highest education level: Not on file  Occupational History   Not on file  Tobacco Use   Smoking status: Former Smoker    Quit date: 08/23/2005    Years since quitting: 14.8   Smokeless tobacco: Never Used  Vaping Use   Vaping Use: Never used  Substance and Sexual Activity   Alcohol use: Yes    Comment: wine with dinner   Drug use: Not Currently   Sexual activity: Not on file  Other Topics Concern   Not on file  Social History Narrative   Not on file   Social Determinants of Health   Financial Resource Strain:    Difficulty of Paying Living Expenses: Not on file  Food Insecurity:    Worried About Charity fundraiser in the Last Year: Not on file   [employer] of Food in the Last Year: Not on file  Transportation Needs:    Lack of Transportation (Medical): Not on file   Lack of Transportation (Non-Medical): Not on file  Physical Activity:    Days of Exercise per Week: Not on file   Minutes of Exercise per Session: Not on file  Stress:    Feeling of Stress : Not on file  Social Connections:     Frequency of Communication with Friends and Family: Not on file   Frequency of Social Gatherings with Friends and Family: Not on file   Attends Religious Services: Not on file   Active Member of [place] or Organizations: Not on file   Attends Archivist Meetings: Not on file   Marital Status: Not on file  Intimate Partner Violence:    Fear of Current or Ex-Partner: Not on file   Emotionally Abused: Not on file   Physically Abused: Not on file   Sexually Abused: Not on file    Allergies  Allergen Reactions   Eliquis [Apixaban] Swelling    Face swelling    Iodinated Diagnostic Agents Itching    Current Facility-Administered Medications  Medication Dose Route Frequency Provider Last Rate Last Admin   albuterol (VENTOLIN HFA) 108 (90 Base) MCG/ACT inhaler 1-2 puff  1-2 puff Inhalation PRN Asencion Noble, MD       heparin injection 5,000 Units  5,000 Units Subcutaneous Q8H Sherry Ruffing, Marissa M, MD       lipase/protease/amylase (CREON) capsule 24,000 Units  24,000 Units Oral TID WC Asencion Noble, MD       pantoprazole (PROTONIX) EC tablet 80 mg  80 mg Oral Daily Sherry Ruffing, Marissa M, MD        ROS:   General:  No weight loss, Fever, chills  HEENT: No recent headaches, no nasal bleeding, no visual changes, no sore throat  Neurologic: No dizziness, blackouts, seizures. No recent symptoms of stroke or mini- stroke. No recent episodes of slurred speech, or temporary blindness.  Cardiac: No recent episodes of chest pain/pressure, no shortness of breath at rest.  No shortness of breath with exertion.  Denies history of atrial fibrillation or irregular heartbeat  Vascular: No history of rest pain in feet.  No history of claudication.  No history of non-healing ulcer, No history of DVT   Pulmonary: No home oxygen, no productive cough, no hemoptysis,  No asthma or wheezing  Musculoskeletal:  [ ]  Arthritis, [ ]  Low back pain,  [ ]  Joint  pain  Hematologic:No history of hypercoagulable state.  No history of easy bleeding.  No history of anemia  Gastrointestinal: No hematochezia or melena,  No gastroesophageal reflux, no trouble swallowing  Urinary: [ ]  chronic Kidney disease, [ ]  on HD - [ ]  MWF or [ ]  TTHS, [ ]  Burning with urination, [ ]  Frequent urination, [ ]  Difficulty urinating;   Skin: No rashes  Psychological: No history of anxiety,  No history of depression   Physical Examination  Vitals:   06/15/20 0900 06/15/20 0930 06/15/20 1159 06/15/20 1254  BP: 120/68 126/77 120/65 119/73  Pulse: 74 88 88 97  Resp:   16 16  Temp:  98.9 F (37.2 C) 98 F (36.7 C)  TempSrc:   Oral Oral  SpO2: 92% 94% 95% 96%  Weight:      Height:        Body mass index is 20.83 kg/m.  General:  Alert and oriented, no acute distress HEENT: Normal Neck: Bilateral JVD Pulmonary: Clear to auscultation bilaterally, prominent chest wall collaterals diffusely across the anterior chest and extending from the neck down onto the upper shoulder area.,  Port-A-Cath right chest wall. Cardiac: Regular Rate and Rhythm  Abdomen: Soft, non-tender, non-distended, no mass Skin: No rash Extremity Pulses:  2+ radial, brachial, femoral pulses bilaterally Musculoskeletal: No deformity or edema  Neurologic: Upper and lower extremity motor 5/5 and symmetric  DATA:  I reviewed the patient's CT scan of the chest which shows subtotal occlusion of the superior vena cava with a Port-A-Cath in place and abundant chest wall collaterals.  Also reviewed images from her CT scan of the abdomen and pelvis which shows a mass in the head of the pancreas and some thrombus in the proximal superior mesenteric vein.  ASSESSMENT: 1.  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  I discussed with the patient and her daughter today to potentially remove the Port-A-Cath to see if she has recanalization of her superior vena cava.  I suspect the swelling  symptoms she had of her face and arms and thighs was more related to superior vena cava obstruction rather than an allergic reaction to Eliquis.  The fact that she has had decreased swelling and increased collateralization suggests that most likely her body has adapted to the superior vena cava obstruction.  I discussed with her options of potentially removing the Port-A-Cath but told her this would be a better discussion to have with her oncology doctor at [facility] as they would have a better indication for how much longer the Port-A-Cath is going to be necessary.  Certainly treating her with anticoagulation and leaving the Port-A-Cath in place is a consideration but I do not believe she will have any recanalization of her superior vena cava in this scenario.  2.  Superior mesenteric vein thrombosis.  Difficult to know whether or not this is acute or chronic.  Certainly having a mass in the head of the pancreas puts her at increased risk of this.  This could either be secondary to hypercoagulable state inflammation or obstruction from her tumor.  If this is new thrombus then the possibility of switching her to Lovenox rather than oral anticoagulation may be a consideration.  I think in the short-term.  I would switch her to subcutaneous therapeutic Lovenox daily to make sure she has not having ongoing thrombosis in the face of NOAC.  This again would be a decision her hematologist to make as far as long-term management whether to switch her back to a NOAC or continue lifelong Lovenox.  I discussed with the patient today that certainly superior mesenteric vein thrombosis can be a life-threatening situation but her symptoms currently are fairly mild and benign and again she may have adequate flow of her venous system outflow from her graft currently.  PLAN: 1.  See above recommendations and assessment.  2.  I would switch the patient to therapeutic Lovenox instead of NOAC for right now.  She should follow-up  with her hematologist at her regularly scheduled appointment next week.  If they decide the Port-A-Cath is no longer necessary we could consider removing this and then see if her superior vena cava obstruction  improves and if not consider potentially angioplasty or stenting of this area.  However, I did discuss with her that angioplasty and stenting in this area is usually of limited durability and certainly would require some maintenance work and is not a procedure to be taken lightly as there is certainly some risk involved.  If she wishes to pursue this in the near future she can certainly see me for an outpatient follow-up visit.  Otherwise she will follow up on an as-needed basis.   Ruta Hinds, MD [facility] Office: [phone]  Copies of my note today sent to Dr. Caryl Ada and the patient's primary care physician Dr. Rory Percy

## 2020-06-15 NOTE — Progress Notes (Signed)
This nurse spoke with Zella Ball RN regarding patient consult for PIV. At this time patient does not have infusion or PRN IV orders. Instructed to consult when infusion orders if VAST still needed. RN VU. Fran Lowes, RN VAST

## 2020-06-15 NOTE — ED Notes (Signed)
Pt pulled to Triage to be seen by PA.

## 2020-06-15 NOTE — H&P (Signed)
Date: 06/15/2020               Patient Name:  Beth Brock MRN: [id number]  DOB: [birth year] Age / Sex: 69 y.o., female   PCP: Audley Hose, MD         Medical Service: Internal Medicine Teaching Service         Attending Physician: Dr. Evette Doffing, Mallie Mussel, *    First Contact: Dr. Candie Chroman Pager: [phone]  Second Contact: Dr. Marianna Payment Pager: [phone]       After Hours (After 5p/  First Contact Pager: [phone]  weekends / holidays): Second Contact Pager: [phone]   Chief Complaint: Left neck pain and swelling  History of Present Illness: This is a 69 year old female with a history of pancreatic cancer, pulmonary embolism on Xarelto, COPD, diverticulitis, hyperlipidemia, and osteoporosis presenting with left neck pain and swelling of neck veins.   She was recently diagnosed with a PE and initially started on Eliquis, developed swelling of the veins of the left side of her neck no associated pain at that time, she developed an allergic reaction to Eliquis and was subsequently switched to Xarelto.  She reports that her face started getting red and swollen, occurred on both sides, so she was switched and started on a prednisone taper.  Denies any issues taking his medications.  She states that this morning she started having left-sided neck pain and worsening shortness of breath, she went to her PCP for a regular checkup and while there she was told to come into the hospital for further evaluation due to the persistent neck vein distention and neck pain.  She denies any chest pain, leg swelling, difficulty swallowing, choking, coughing, fevers, chills, headaches, lightheadedness, dizziness, or any other symptoms..  Also reports that today she gross hematuria, left flank pain, and loose stools.  She also states that she has had mildly decreased urination.  Denies any dysuria, increased urinary frequency, or other symptoms.   In the ER patient was noted to be afebrile, respiratory rate  and heart rate stable, blood pressure mildly up to 148/83.  Troponins flat at 6 and 6.  CMP relatively unremarkable.  CBC unremarkable.  Lipase 19.  Urinalysis showed red-colored urine, large hemoglobin, negative protein, negative nitrites ketones, and trace leukocytes.  CT chest, abdomen, pelvis showed patchy areas of nodular peripheral scarring, likely fibrosis or inflammatory, metastasis is possible but appearance is more likely inflammatory, diffuse emphysematous changes, focal narrowing of the mid/distal SVC with multiple clot collateral veins in the right neck, 1.9 cm diameter structure in the right base of the neck, possibly a thrombosed right jugular vein, mild pancreatic ductal dilation, suggestion of focal narrowing of the distal superior mesenteric vein, left hydronephrosis likely ureteropelvic junction obstruction, and 1.1 cm cyst at the junction of the body and the tail of the pancreas.  Chest x-ray showed PowerPort cath with tip over SVC, mild infiltrate in the left upper lung.  EKG showed normal sinus rhythm with no acute ST changes.  Vascular surgery was consulted and will see in the morning.  Patient was given fentanyl, Percocet, and ceftriaxone admitted to IMTS.    Meds:  Current Meds  Medication Sig  . albuterol (VENTOLIN HFA) 108 (90 Base) MCG/ACT inhaler Inhale 1-2 puffs into the lungs as needed for wheezing or shortness of breath.  . Ascorbic Acid (VITAMIN C PO) Take 2 tablets by mouth daily.  Marland Kitchen CALCIUM PO Take 1 tablet by mouth daily.  Marland Kitchen  Cholecalciferol (VITAMIN D3 PO) Take 1 tablet by mouth daily.  Marland Kitchen CREON 24000-76000 units CPEP Take 2 capsules by mouth 3 (three) times daily with meals.   . methylPREDNISolone (MEDROL) 4 MG tablet Take 4-24 mg by mouth daily. 30m on day 1 219mon day 2 1625mn day 3 56m8m day 4 8mg 58mday 5 4mg o45may 6  . Noni, [place]inda citrifolia, (NONI JUICE PO) Take 30 mLs by mouth daily.  . omepMarland Kitchenazole (PRILOSEC) 40 MG capsule Take 40 mg by mouth as  needed (acid reflux).   . PolyVladimir Fasterl-Propyl Glycol (LUBRICANT EYE DROPS) 0.4-0.3 % SOLN Place 2 drops into both eyes as needed (for dry/irritated eyes.).   . procMarland Kitchenlorperazine (COMPAZINE) 10 MG tablet Take 10 mg by mouth every 6 (six) hours.  . XAREAlveda Reasons TABS tablet Take 20 mg by mouth daily.      Allergies: Allergies as of 06/14/2020  . (No Known Allergies)   Past Medical History:  Diagnosis Date  [facility]   . Pulmonary embolism (HCC)  AlexanderFamily History: Mother had breast cancer.  Sister had multiple myeloma.  Paternal grandmother had breast cancer.  Social History: Denies smoking or drug use.  Reports occasional alcohol use, about 2 glasses/month.  Lives alone, daughter lives close by.  Review of Systems: A complete ROS was negative except as per HPI.   Physical Exam: Blood pressure 119/73, pulse 97, temperature 98 F (36.7 C), temperature source Oral, resp. rate 16, height 5' 7"  (1.702 m), weight 60.3 kg, SpO2 96 %. Physical Exam Constitutional:      Appearance: Normal appearance.  HENT:     Head: Normocephalic and atraumatic.     Nose: Nose normal.     Mouth/Throat:     Mouth: Mucous membranes are moist.     Pharynx: Oropharynx is clear.  Neck:     Comments: Venous distension of neck veins, mild TTP over left lateral neck, no erythema or rash noted Cardiovascular:     Rate and Rhythm: Normal rate and regular rhythm.     Pulses: Normal pulses.     Heart sounds: Normal heart sounds.  Pulmonary:     Effort: Pulmonary effort is normal.     Breath sounds: Normal breath sounds.  Chest:     Chest wall: No tenderness.  Abdominal:     General: Abdomen is flat. Bowel sounds are normal.     Palpations: Abdomen is soft.  Musculoskeletal:        General: No swelling. Normal range of motion.     Cervical back: Normal range of motion and neck supple.  Skin:    General: Skin is warm and dry.     Capillary Refill: Capillary refill takes less  than 2 seconds.     Comments: Port-A-Cath on right upper chest wall  Neurological:     General: No focal deficit present.     Mental Status: She is alert and oriented to person, place, and time.  Psychiatric:        Mood and Affect: Mood normal.        Behavior: Behavior normal.      EKG: personally reviewed my interpretation is normal sinus rhythm, heart rate 90s, no acute ST changes, isolated V6 wandering lead  CXR: personally reviewed my interpretation is hyperinflated, Port-A-Cath in place, no cardiomegaly, increased pulmonary congestion, mild infiltrate left upper lobe  CT abdomen, chest, pelvis with contrast: IMPRESSION: 1. Patchy  areas of nodular peripheral scarring in the lung bases bilaterally, new since prior study, likely fibrosis or inflammatory. Metastatic deposits are possible but the appearance is more likely to represent an inflammatory process. Short-term follow-up is suggested. 2. Diffuse emphysematous change in the lungs. 3. Focal narrowing of the mid/distal SVC with multiple collateral veins in the right neck. 4. 1.9 cm diameter structure in the right base of the neck with central low-attenuation probably represents a thrombosed right jugular vein. 5. Mild pancreatic ductal dilatation with fullness in the head of the pancreas corresponding to known pancreatic lesion. 6. Effacement of the fat around the celiac axis. 7. Suggestion of focal narrowing of the distal superior mesenteric vein with possible focal thrombosis versus unopacified blood return. This is developing since the previous study. 8. 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 No stones identified. 9. 1.1 cm cyst at the junction of the body and tail of the pancreas, similar to prior study. 10. Emphysema and aortic atherosclerosis.  Aortic Atherosclerosis (ICD10-I70.0) and Emphysema (ICD10-J43.9).  Assessment & Plan by Problem: Active  Problems:   SVC (superior vena cava obstruction)  Left sided neck pain, likely SVC This is a 69 year old female with a history of pancreatic cancer, pulmonary embolism on Xarelto, COPD, diverticulitis, hyperlipidemia, and osteoporosis presenting with left neck pain and swelling of neck veins.  Reported compliance with her Xarelto.  Denies any symptoms such as difficulty swallowing, shortness of breath, headaches, lightheadedness, or syncope.  Appears to have mild/moderate symptoms.  Patient does have a history of pancreatic cancer that is currently un-staged, has Port-A-Cath in place that appears to press against the SVC on CXR. CT chest, abdomen, pelvis showed focal narrowing of the mid/distal SVC with multiple collateral veins in the right neck. Patient may need to have this removed vs stent placement, will defer to vascular surgery.   -Continue tylenol PRN for pain control -Vascular consulted, appreciate recommendations -Placed on heparin for now pending vascular recommendations  History of pulmonary embolism: Currently on Xarelto, oxygenating well on room air. Reports compliance with Alen Blew, had been on eliquis before however developed a allergic reaction to this.   -Will resume home Xeralto if vascular does not plan intervention  Hematuria, left-sided flank pain: Urinalysis showed red urine, large hemoglobin, trace leukocytes, 100 protein, and few bacteria.  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  Patient received 1 dose of Rocephin in the ER.  Urine sent for cultures.  -F/u urine cultures -Hold off on further antibiotics at this time -Monitor CBC -Monitor for fevers -Patient may need cystoscopy in future  COPD: Patient on albuterol as needed.  No wheezing or shortness of breath noted.  Resumed home albuterol.  Pancreatic cancer: Suspected pancreatic adenocarcinoma based on  imaging findings and continued rise in the patient's CA 19-9, underwent multiple biopsy attempts however specific cytopathologic diagnosis has not been achieved.  Follows with [facility] and is undergoing chemotherapy.  Last saw oncology on 8/10, was not tolerating capecitabine well, holding off on further doses at this time until follow-up on October 5.  Dispo: Admit patient to Inpatient with expected length of stay greater than 2 midnights.  Signed: Asencion Noble, MD 06/15/2020, 1:46 PM  Pager: [phone] After 5pm on weekdays and 1pm on weekends: On Call pager: [phone]

## 2020-06-15 NOTE — ED Provider Notes (Signed)
[facility] EMERGENCY DEPARTMENT Provider Note   CSN: [id number] Arrival date & time: 06/14/20  1209     History No chief complaint on file.   Beth Brock is a 69 y.o. female with a history of pancreatic cancer, pulmonary embolism, COPD, diverticulitis, hyperlipidemia, and osteoporosis who complains of neck pain and swelling.  The patient was diagnosed with a PE in early August.  She was started on Eliquis.  After starting the medication, she noticed swelling of the veins on the left side of her neck, but there was no associated pain.  Over the next month, she developed bilateral swelling to the veins of the neck that now extended down the anterior chest wall and to her upper back.  After 1 month, she developed an allergic reaction to Eliquis and the medication was changed to Xarelto, which she has been compliant with.  Today, she developed pain to the left side of her neck, which was new.  She has had mild, progressively worsening shortness of breath.  Symptoms have not worsened today.  No chest pain, leg swelling, drooling, dysphagia, choking episodes, or coughing.  No fever or chills.  She also reports that she had developed gross hematuria yesterday.  Urine then became pink and minimally orange and is almost back to her baseline.  She has been having aching left flank pain for the last 2 days that will radiate into her left lower quadrant.  No history of pyelonephritis or kidney stones.  She denies dysuria, urinary frequency or hesitancy, or vomiting.  She had a longstanding history of loose stools but recently was started on Creon and has since become constipated.  Previously, she would have a bowel movement almost immediately after eating, but now she is only having approximately 1 bowel movement every 1 to 2 days.  She is passing flatus.  The history is provided by the patient and medical records. No language interpreter was used.       No past medical history  on file.  There are no problems to display for this patient.   Past Surgical History:  Procedure Laterality Date  . BREAST EXCISIONAL BIOPSY Left   . BREAST EXCISIONAL BIOPSY Left   . BREAST EXCISIONAL BIOPSY Right   . ESOPHAGOGASTRODUODENOSCOPY N/A 08/23/2018   Procedure: ESOPHAGOGASTRODUODENOSCOPY (EGD);  Surgeon: Carol Ada, MD;  Location: [facility];  Service: Endoscopy;  Laterality: N/A;  . UPPER ESOPHAGEAL ENDOSCOPIC ULTRASOUND (EUS) N/A 08/23/2018   Procedure: UPPER ESOPHAGEAL ENDOSCOPIC ULTRASOUND (EUS);  Surgeon: Carol Ada, MD;  Location: [facility];  Service: Endoscopy;  Laterality: N/A;     OB History   No obstetric history on file.     Family History  Problem Relation Age of Onset  . Breast cancer Mother 64  . Breast cancer Paternal Grandmother 84    Social History   Tobacco Use  . Smoking status: Former Smoker    Quit date: 08/23/2005    Years since quitting: 14.8  . Smokeless tobacco: Never Used  Vaping Use  . Vaping Use: Never used  Substance Use Topics  . Alcohol use: Yes    Comment: wine with dinner  . Drug use: Not Currently    Home Medications Prior to Admission medications   Medication Sig Start Date End Date Taking? Authorizing Provider  alendronate (FOSAMAX) 70 MG tablet Take 70 mg by mouth once a week. 04/06/20   [provider]  ELIQUIS 5 MG TABS tablet Take 5 mg by mouth 2 (two)  times daily. 05/25/20   [provider]  famotidine (PEPCID) 20 MG tablet Take 20 mg by mouth 2 (two) times daily. 06/08/20   [provider]  Multiple Vitamin (MULTIVITAMIN WITH MINERALS) TABS tablet Take 1 tablet by mouth daily. Multivitamin for Adults 50+    [provider]  Noni, Morinda citrifolia, (NONI JUICE PO) Take 30 mLs by mouth daily.    [provider]  omeprazole (PRILOSEC) 20 MG capsule Take 20 mg by mouth daily at 3 pm.    [provider]  omeprazole (PRILOSEC) 40 MG capsule Take 40 mg by mouth  daily before breakfast.    [provider]  Polyethyl Glycol-Propyl Glycol (LUBRICANT EYE DROPS) 0.4-0.3 % SOLN Place 1 drop into both eyes 3 (three) times daily as needed (for dry/irritated eyes.).    [provider]  prochlorperazine (COMPAZINE) 10 MG tablet Take 10 mg by mouth every 6 (six) hours. 02/24/20   [provider]  TURMERIC PO Take 1 capsule by mouth 4 (four) times a week.    [provider]  XARELTO 20 MG TABS tablet Take 20 mg by mouth daily. 06/08/20   [provider]    Allergies    Patient has no known allergies.  Review of Systems   Review of Systems  Constitutional: Negative for activity change, chills and fever.  HENT: Negative for congestion and sore throat.   Eyes: Negative for visual disturbance.  Respiratory: Positive for shortness of breath. Negative for cough.   Cardiovascular: Negative for chest pain.  Gastrointestinal: Positive for abdominal pain and constipation. Negative for diarrhea, nausea and vomiting.  Genitourinary: Positive for flank pain. Negative for dysuria and urgency.  Musculoskeletal: Positive for myalgias and neck pain. Negative for arthralgias and back pain.  Skin: Negative for rash.  Allergic/Immunologic: Negative for immunocompromised state.  Neurological: Negative for seizures, syncope, weakness, numbness and headaches.  Psychiatric/Behavioral: Negative for confusion.    Physical Exam Updated Vital Signs BP (!) 141/85 (BP Location: Left Arm)   Pulse 91   Temp 98 F (36.7 C) (Oral)   Resp 19   Ht 5\' 7"  (1.702 m)   Wt 60.3 kg   SpO2 100%   BMI 20.83 kg/m   Physical Exam Vitals and nursing note reviewed.  Constitutional:      General: She is not in acute distress.    Appearance: She is not ill-appearing, toxic-appearing or diaphoretic.  HENT:     Head: Normocephalic.     Nose: Nose normal.  Eyes:     Conjunctiva/sclera: Conjunctivae normal.  Neck:     Comments: No meningismus.   Possible single left-sided lymph node to the left anterior cervical chain. Cardiovascular:     Rate and Rhythm: Normal rate and regular rhythm.     Pulses: Normal pulses.     Heart sounds: Normal heart sounds. No murmur heard.  No friction rub. No gallop.      Comments: Significant, diffuse dilated venous vasculature noted bilaterally to the upper anterior chest, bilateral neck, bilateral arms, and extending to the right upper back. Pulmonary:     Effort: Pulmonary effort is normal. No respiratory distress.     Breath sounds: No stridor. No wheezing, rhonchi or rales.  Chest:     Chest wall: No tenderness.  Abdominal:     General: There is no distension.     Palpations: Abdomen is soft. There is no mass.     Tenderness: There is abdominal tenderness. There is left  CVA tenderness. There is no right CVA tenderness, guarding or rebound.     Hernia: No hernia is present.     Comments: Abdomen is soft and nondistended.  Tender to palpation to the left CVA region.  No right-sided CVA tenderness.  There is tenderness to palpation in the left lower quadrant with no rebound or guarding.  No tenderness over McBurney's point.  Negative Rovsing sign.  Negative Murphy sign.  Normoactive bowel sounds.  Musculoskeletal:     Cervical back: Neck supple.     Right lower leg: No edema.     Left lower leg: No edema.     Comments: No tenderness to the cervical, thoracic, or lumbar spinous processes.  No crepitus or step-offs.  Full active and passive range of motion of the cervical spine.  Skin:    General: Skin is warm.     Capillary Refill: Capillary refill takes less than 2 seconds.     Coloration: Skin is not jaundiced.     Findings: No rash.  Neurological:     Mental Status: She is alert.  Psychiatric:        Behavior: Behavior normal.     ED Results / Procedures / Treatments   Labs (all labs ordered are listed, but only abnormal results are displayed) Labs Reviewed  COMPREHENSIVE METABOLIC  PANEL - Abnormal; Notable for the following components:      Result Value   Glucose, Bld 103 (*)    Calcium 10.4 (*)    GFR calc non Af Amer 57 (*)    All other components within normal limits  URINALYSIS, ROUTINE W REFLEX MICROSCOPIC - Abnormal; Notable for the following components:   Color, Urine RED (*)    Specific Gravity, Urine <1.005 (*)    Hgb urine dipstick LARGE (*)    Protein, ur 100 (*)    Leukocytes,Ua TRACE (*)    All other components within normal limits  URINALYSIS, MICROSCOPIC (REFLEX) - Abnormal; Notable for the following components:   Bacteria, UA FEW (*)    All other components within normal limits  URINE CULTURE  RESPIRATORY PANEL BY RT PCR (FLU A&B, COVID)  LIPASE, BLOOD  CBC WITH DIFFERENTIAL/PLATELET  TROPONIN I (HIGH SENSITIVITY)  TROPONIN I (HIGH SENSITIVITY)    EKG EKG Interpretation  Date/Time:  Monday June 14 2020 13:20:40 EDT Ventricular Rate:  90 PR Interval:  118 QRS Duration: 82 QT Interval:  338 QTC Calculation: 413 R Axis:   78 Text Interpretation: Normal sinus rhythm Nonspecific ST and T wave abnormality Abnormal ECG No old tracing to compare Confirmed by Delora Fuel ([id number]) on 06/14/2020 11:20:38 PM   Radiology DG Chest 2 View  Result Date: 06/14/2020 CLINICAL DATA:  Chest pain. EXAM: CHEST - 2 VIEW COMPARISON:  No prior. FINDINGS: PowerPort catheter noted with tip over SVC. Heart size normal. Mild infiltrate left upper lung. Mild bibasilar atelectasis. No pleural effusion or pneumothorax. IMPRESSION: 1. PowerPort catheter with tip over SVC. 2. Mild infiltrate left upper lung. Mild bibasilar atelectasis. Electronically Signed   By: Marcello Moores  Register   On: 06/14/2020 14:15   CT CHEST ABDOMEN PELVIS W CONTRAST  Result Date: 06/15/2020 CLINICAL DATA:  History of pancreatic cancer and pulmonary embolus on Xarelto. Swelling of bilateral veins in the chest wall, neck, and arms. Gross hematuria and left flank pain 2 days ago. EXAM: CT  CHEST, ABDOMEN, AND PELVIS WITH CONTRAST TECHNIQUE: Multidetector CT imaging of the chest, abdomen and pelvis was performed following the standard protocol  during bolus administration of intravenous contrast. CONTRAST:  118mL OMNIPAQUE IOHEXOL 300 MG/ML  SOLN COMPARISON:  CT abdomen and pelvis 07/18/2018 FINDINGS: CT CHEST FINDINGS Cardiovascular: Normal heart size. No pericardial effusions. Normal caliber thoracic aorta with scattered calcifications. No aortic dissection. Central pulmonary arteries are moderately well opacified without evidence of pulmonary embolus. Great vessel origins are patent. Central venous catheter with tip in the superior vena cava. Focal narrowing of the mid/distal SVC. Collateral veins in the right neck. 1.9 cm diameter structure in the right base of the neck with central low-attenuation probably represents a thrombosed right jugular vein. Mediastinum/Nodes: Esophagus is decompressed. No significant lymphadenopathy. Lungs/Pleura: Patchy areas of nodular peripheral scarring in the lung bases bilaterally, likely fibrosis or inflammatory. Metastatic deposits are possible but the appearance is more likely to represent an inflammatory process. Short-term follow-up is suggested. Diffuse emphysematous change in the lungs. No focal consolidation otherwise demonstrated. Airways are patent. No pleural effusions. No pneumothorax. Musculoskeletal: Degenerative changes in the spine. No destructive bone lesions. CT ABDOMEN PELVIS FINDINGS Hepatobiliary: No focal liver abnormality is seen. No gallstones, gallbladder wall thickening, or biliary dilatation. Pancreas: Mild pancreatic ductal dilatation with fullness in the head of the pancreas corresponding to known pancreatic lesion. Effacement of the fat around the celiac axis. Suggestion of focal narrowing of the distal superior mesenteric vein with possible focal thrombosis versus unopacified blood return. This is developing since the previous study.  There is a cyst at the junction of the body and tail of the pancreas measuring 1.1 cm diameter, similar to prior study. Spleen: Normal in size without focal abnormality. Adrenals/Urinary Tract: No adrenal gland nodules. Kidneys are symmetrical with homogeneous nephrograms. 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 No stones identified. Bladder is normal. Stomach/Bowel: Stomach is within normal limits. Appendix appears normal. No evidence of bowel wall thickening, distention, or inflammatory changes. Vascular/Lymphatic: Calcification of the aorta. No retroperitoneal lymphadenopathy. Reproductive: Status post hysterectomy. No adnexal masses. Other: No free air or free fluid in the abdomen. Abdominal wall musculature appears intact. Musculoskeletal: Degenerative changes in the spine. No destructive bone lesions. IMPRESSION: 1. Patchy areas of nodular peripheral scarring in the lung bases bilaterally, new since prior study, likely fibrosis or inflammatory. Metastatic deposits are possible but the appearance is more likely to represent an inflammatory process. Short-term follow-up is suggested. 2. Diffuse emphysematous change in the lungs. 3. Focal narrowing of the mid/distal SVC with multiple collateral veins in the right neck. 4. 1.9 cm diameter structure in the right base of the neck with central low-attenuation probably represents a thrombosed right jugular vein. 5. Mild pancreatic ductal dilatation with fullness in the head of the pancreas corresponding to known pancreatic lesion. 6. Effacement of the fat around the celiac axis. 7. Suggestion of focal narrowing of the distal superior mesenteric vein with possible focal thrombosis versus unopacified blood return. This is developing since the previous study. 8. 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 No stones identified. 9. 1.1 cm cyst at the junction of  the body and tail of the pancreas, similar to prior study. 10. Emphysema and aortic atherosclerosis. Aortic Atherosclerosis (ICD10-I70.0) and Emphysema (ICD10-J43.9). Electronically Signed   By: Lucienne Capers M.D.   On: 06/15/2020 02:59    Procedures Procedures (including critical care time)  Medications Ordered in ED Medications  iohexol (OMNIPAQUE) 300 MG/ML solution 100 mL (100 mLs Intravenous Contrast Given 06/15/20 0215)  ondansetron (ZOFRAN-ODT) disintegrating tablet 4 mg (4 mg Oral  Given 06/15/20 0321)  oxyCODONE-acetaminophen (PERCOCET/ROXICET) 5-325 MG per tablet 1 tablet (1 tablet Oral Given 06/15/20 0321)  cefTRIAXone (ROCEPHIN) 1 g in sodium chloride 0.9 % 100 mL IVPB (1 g Intravenous New Bag/Given 06/15/20 0607)    ED Course  I have reviewed the triage vital signs and the nursing notes.  Pertinent labs & imaging results that were available during my care of the patient were reviewed by me and considered in my medical decision making (see chart for details).  Clinical Course as of Jun 16 727  Tue Jun 15, 2020  2595 Spoke with Dr. Oneida Alar, vascular surgery, regarding narrowing of the SVC and changes to the distal superior mesenteric vein.  No intervention indicated for vascular surgery at this time.  Suggest that management will be related to her treatment of pancreatic cancer.  However, Dr. Oneida Alar will evaluate the patient in the a.m.   [MM]    Clinical Course User Index [MM] Jairo Bellew, Laymond Purser, PA-C   MDM Rules/Calculators/A&P                          69 year old female with a history of pancreatic cancer, pulmonary embolism, COPD, diverticulitis, hyperlipidemia, and osteoporosis who presents to the emergency department with swelling to the veins of the upper chest wall, neck, and arms for almost 2 months that has been gradually worsening since onset.  Today, she began to develop pain to the left side of the neck, which prompted her to schedule a visit with her PCP who advised  her to come to the ER for further work-up and evaluation.  On exam, she is in no acute distress. Airway appears patent and intact. She does have diffuse distention of the venous vasculature of the upper chest, neck, and arms. She is currently undergoing chemotherapy. She was diagnosed with a right internal jugular vein thrombosis as well as a RLL subsegmental PE and possible LUL PE. She is on Eliquis for 1 month and then changed to Xarelto after presumed allergic reaction. She has been compliant with no missed doses. She does report that she has been having gradually worsening shortness of breath.  Patient was seen and independently evaluated by Dr. Roxanne Mins, attending physician.  Chest x-ray with a PowerPort catheter with tip over the SVC. There is also a mild infiltrate of the left upper lung on my evaluation. She is not hypoxic or tachypneic. EKG with normal sinus rhythm. Delta troponin is not elevated. This is suspicious for superior vena cava syndrome caused by increasing tumor burden versus worsening clot burden. Discussed with Dr. Carver Fila, radiology, who recommends CT Chest. (No PE study).   There is also left flank pain on exam and she has been having gross hematuria over the last 2 days. UA with large hemoglobinuria, trace leukocyte esterase, mild proteinuria, and RBCs. Urine culture sent. It is less suspicious for infection, but patient is also having left CVA tenderness. We will also on imaging of the abdomen and pelvis for further evaluation of ureteral obstruction.    CT with patchy areas of nodular peripheral scarring in the lung bases, new since previous study that was thought to represent fibrosis, inflammation, or less likely metastatic deposits. There is also diffuse emphysematous changes in the lung.  CT also demonstrates a focal narrowing of the mid/distal SVC with multiple collateral veins in the right neck. There is also a 1.9 cm structure in the base of the right neck with central  low-attenuation probably  represents a thrombosed right jugular vein (this is consistent with previous PE study in August).  There is also focal narrowing of the distal superior mesenteric vein with possible focal thrombosis versus unopacified blood return, developing since previous study.  There is also noted to be of left hydronephrosis with decompression of the ureter, likely ureteropelvic junction obstruction with no stones identified.  Discussed the patient with Dr. Oneida Alar, vascular surgery, regarding etiology of superior vena cava syndrome and guidance with management. Vascular surgery will come to evaluate the patient in the morning.   Regarding left hydronephrosis, will send urine for culture since she has been having pain and did have a severe episode of pain with voiding in the ER. Will cover with Rocephin. She may also require urological consult for further evaluation of her symptoms.  Given her multiple, complex medical issues, she will require admission. At shift change, patient was signed out to [facility], pending medical admission  The patient appears reasonably stabilized for admission considering the current resources, flow, and capabilities available in the ED at this time, and I doubt any other [facility] requiring further screening and/or treatment in the ED prior to admission.  Final Clinical Impression(s) / ED Diagnoses Final diagnoses:  Left flank pain  Superior vena cava syndrome  Hydronephrosis of left kidney    Rx / DC Orders ED Discharge Orders    None       Joanne Gavel, PA-C 36/64/40 [id number]    Delora Fuel, MD 25/95/63 [id number]

## 2020-06-15 NOTE — ED Provider Notes (Signed)
Care assumed from  [place] PA-C at change of shift pending consult for admission. Please see her note for further H&P.   08:28: CONSULT: Discussed with internal medicine residency service- accept admission.    Leafy Kindle 69/86/14 [id number]    Delora Fuel, MD 35/43/01 [id number]

## 2020-06-15 NOTE — H&P (View-Only) (Signed)
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 female, with a known history of pancreatic cancer.  She has previously received all of her cancer therapy at [facility].  She has primarily had chemotherapy.  Several weeks ago she was noted on what sounds like routine CT scan at [facility] to have had a pulmonary embolus.  She has a right-sided Port-A-Cath.  I suspect this was the nidus of her pulmonary embolus although the notes and images are not available for review today.  She was asymptomatic from this.  She did not have any shortness of breath chest pain or hemoptysis.  She was placed on Eliquis at that time.  A few days after this she experienced facial swelling bilateral eye swelling and bilateral upper extremity swelling.  She did not have any swelling or hives on her body.  She did not have any lower extremity swelling.  It was thought that potentially she was having an allergic reaction to Eliquis and she was switched to Xarelto.  Since that time she has not really had much facial swelling but has developed a very prominent veins on her neck and chest.  She has not had her Xarelto in almost 48 hours due to recent hospitalization.  She has never had a prior DVT.  She also has had some mild left upper and lower quadrant abdominal pain.  This has been intermittent in nature.  She has also had some constipation recently.  She currently does not have abdominal pain.  She states she has recently been on a chemotherapy holiday but has follow-up scheduled with her cancer doctor on October 5 at [facility].  She has not had any other central vein instrumentation that she can remember other than her right-sided Port-A-Cath.  Past Medical History:  Diagnosis Date  . Cancer [facility])    Pancreatic   . Pulmonary  embolism [facility])    Past Surgical History:  Procedure Laterality Date  . BREAST EXCISIONAL BIOPSY Left   . BREAST EXCISIONAL BIOPSY Left   . BREAST EXCISIONAL BIOPSY Right   . ESOPHAGOGASTRODUODENOSCOPY N/A 08/23/2018   Procedure: ESOPHAGOGASTRODUODENOSCOPY (EGD);  Surgeon: Carol Ada, MD;  Location: [facility];  Service: Endoscopy;  Laterality: N/A;  . UPPER ESOPHAGEAL ENDOSCOPIC ULTRASOUND (EUS) N/A 08/23/2018   Procedure: UPPER ESOPHAGEAL ENDOSCOPIC ULTRASOUND (EUS);  Surgeon: Carol Ada, MD;  Location: [facility];  Service: Endoscopy;  Laterality: N/A;    Family History  Problem Relation Age of Onset  . Breast cancer Mother 15  . Breast cancer Paternal Grandmother 37    SOCIAL HISTORY: Social History   Socioeconomic History  . Marital status: Divorced    Spouse name: Not on file  . Number of children: Not on file  . Years of education: Not on file  . Highest education level: Not on file  Occupational History  . Not on file  Tobacco Use  . Smoking status: Former Smoker    Quit date: 08/23/2005    Years since quitting: 14.8  . Smokeless tobacco: Never Used  Vaping Use  . Vaping Use: Never used  Substance and Sexual Activity  . Alcohol use: Yes    Comment: wine with dinner  . Drug use: Not Currently  . Sexual activity: Not on file  Other Topics Concern  . Not on file  Social History Narrative  . Not on file   Social Determinants of Health   Financial Resource Strain:   . Difficulty of Paying Living Expenses: Not on file  Food Insecurity:   . Worried About Charity fundraiser in the Last Year: Not on file  . Ran Out of Food in the Last Year: Not on file  Transportation Needs:   . Lack of Transportation (Medical): Not on file  . Lack of Transportation (Non-Medical): Not on file  Physical Activity:   . Days of Exercise per Week: Not on file  . Minutes of Exercise per Session: Not on file  Stress:   . Feeling of Stress : Not on file  Social Connections:    . Frequency of Communication with Friends and Family: Not on file  . Frequency of Social Gatherings with Friends and Family: Not on file  . Attends Religious Services: Not on file  . Active Member of Clubs or Organizations: Not on file  . Attends Archivist Meetings: Not on file  . Marital Status: Not on file  Intimate Partner Violence:   . Fear of Current or Ex-Partner: Not on file  . Emotionally Abused: Not on file  . Physically Abused: Not on file  . Sexually Abused: Not on file    Allergies  Allergen Reactions  . Eliquis [Apixaban] Swelling    Face swelling   . Iodinated Diagnostic Agents Itching    Current Facility-Administered Medications  Medication Dose Route Frequency Provider Last Rate Last Admin  . albuterol (VENTOLIN HFA) 108 (90 Base) MCG/ACT inhaler 1-2 puff  1-2 puff Inhalation PRN Asencion Noble, MD      . heparin injection 5,000 Units  5,000 Units Subcutaneous Q8H Asencion Noble, MD      . lipase/protease/amylase (CREON) capsule 24,000 Units  24,000 Units Oral TID WC Asencion Noble, MD      . pantoprazole (PROTONIX) EC tablet 80 mg  80 mg Oral Daily Asencion Noble, MD        ROS:   General:  No weight loss, Fever, chills  HEENT: No recent headaches, no nasal bleeding, no visual changes, no sore throat  Neurologic: No dizziness, blackouts, seizures. No recent symptoms of stroke or mini- stroke. No recent episodes of slurred speech, or temporary blindness.  Cardiac: No recent episodes of chest pain/pressure, no shortness of breath at rest.  No shortness of breath with exertion.  Denies history of atrial fibrillation or irregular heartbeat  Vascular: No history of rest pain in feet.  No history of claudication.  No history of non-healing ulcer, No history of DVT   Pulmonary: No home oxygen, no productive cough, no hemoptysis,  No asthma or wheezing  Musculoskeletal:  [ ]  Arthritis, [ ]  Low back pain,  [ ]  Joint  pain  Hematologic:No history of hypercoagulable state.  No history of easy bleeding.  No history of anemia  Gastrointestinal: No hematochezia or melena,  No gastroesophageal reflux, no trouble swallowing  Urinary: [ ]  chronic Kidney disease, [ ]  on HD - [ ]  MWF or [ ]  TTHS, [ ]  Burning with urination, [ ]  Frequent urination, [ ]  Difficulty urinating;   Skin: No rashes  Psychological: No history of anxiety,  No history of depression   Physical Examination  Vitals:   06/15/20 0900 06/15/20 0930 06/15/20 1159 06/15/20 1254  BP: 120/68 126/77 120/65 119/73  Pulse: 74 88 88 97  Resp:   16 16  Temp:  98.9 F (37.2 C) 98 F (36.7 C)  TempSrc:   Oral Oral  SpO2: 92% 94% 95% 96%  Weight:      Height:        Body mass index is 20.83 kg/m.  General:  Alert and oriented, no acute distress HEENT: Normal Neck: Bilateral JVD Pulmonary: Clear to auscultation bilaterally, prominent chest wall collaterals diffusely across the anterior chest and extending from the neck down onto the upper shoulder area.,  Port-A-Cath right chest wall. Cardiac: Regular Rate and Rhythm  Abdomen: Soft, non-tender, non-distended, no mass Skin: No rash Extremity Pulses:  2+ radial, brachial, femoral pulses bilaterally Musculoskeletal: No deformity or edema  Neurologic: Upper and lower extremity motor 5/5 and symmetric  DATA:  I reviewed the patient's CT scan of the chest which shows subtotal occlusion of the superior vena cava with a Port-A-Cath in place and abundant chest wall collaterals.  Also reviewed images from her CT scan of the abdomen and pelvis which shows a mass in the head of the pancreas and some thrombus in the proximal superior mesenteric vein.  ASSESSMENT: 1.  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  I discussed with the patient and her daughter today to potentially remove the Port-A-Cath to see if she has recanalization of her superior vena cava.  I suspect the swelling  symptoms she had of her face and arms and thighs was more related to superior vena cava obstruction rather than an allergic reaction to Eliquis.  The fact that she has had decreased swelling and increased collateralization suggests that most likely her body has adapted to the superior vena cava obstruction.  I discussed with her options of potentially removing the Port-A-Cath but told her this would be a better discussion to have with her oncology doctor at [facility] as they would have a better indication for how much longer the Port-A-Cath is going to be necessary.  Certainly treating her with anticoagulation and leaving the Port-A-Cath in place is a consideration but I do not believe she will have any recanalization of her superior vena cava in this scenario.  2.  Superior mesenteric vein thrombosis.  Difficult to know whether or not this is acute or chronic.  Certainly having a mass in the head of the pancreas puts her at increased risk of this.  This could either be secondary to hypercoagulable state inflammation or obstruction from her tumor.  If this is new thrombus then the possibility of switching her to Lovenox rather than oral anticoagulation may be a consideration.  I think in the short-term.  I would switch her to subcutaneous therapeutic Lovenox daily to make sure she has not having ongoing thrombosis in the face of NOAC.  This again would be a decision her hematologist to make as far as long-term management whether to switch her back to a NOAC or continue lifelong Lovenox.  I discussed with the patient today that certainly superior mesenteric vein thrombosis can be a life-threatening situation but her symptoms currently are fairly mild and benign and again she may have adequate flow of her venous system outflow from her graft currently.  PLAN: 1.  See above recommendations and assessment.  2.  I would switch the patient to therapeutic Lovenox instead of NOAC for right now.  She should follow-up  with her hematologist at her regularly scheduled appointment next week.  If they decide the Port-A-Cath is no longer necessary we could consider removing this and then see if her superior vena cava obstruction  improves and if not consider potentially angioplasty or stenting of this area.  However, I did discuss with her that angioplasty and stenting in this area is usually of limited durability and certainly would require some maintenance work and is not a procedure to be taken lightly as there is certainly some risk involved.  If she wishes to pursue this in the near future she can certainly see me for an outpatient follow-up visit.  Otherwise she will follow up on an as-needed basis.   Ruta Hinds, MD [facility] [place] Office: [phone]  Copies of my note today sent to Dr. Caryl Ada and the patient's primary care physician Dr. Rory Percy

## 2020-06-16 DIAGNOSIS — C250 Malignant neoplasm of head of pancreas: Secondary | ICD-10-CM

## 2020-06-16 DIAGNOSIS — N1330 Unspecified hydronephrosis: Secondary | ICD-10-CM | POA: Diagnosis present

## 2020-06-16 DIAGNOSIS — R319 Hematuria, unspecified: Secondary | ICD-10-CM

## 2020-06-16 DIAGNOSIS — I871 Compression of vein: Principal | ICD-10-CM

## 2020-06-16 DIAGNOSIS — K55069 Acute infarction of intestine, part and extent unspecified: Secondary | ICD-10-CM | POA: Diagnosis present

## 2020-06-16 DIAGNOSIS — K55059 Acute (reversible) ischemia of intestine, part and extent unspecified: Secondary | ICD-10-CM

## 2020-06-16 LAB — BASIC METABOLIC PANEL
Anion gap: 10 (ref 5–15)
BUN: 8 mg/dL (ref 8–23)
CO2: 26 mmol/L (ref 22–32)
Calcium: 9.9 mg/dL (ref 8.9–10.3)
Chloride: 100 mmol/L (ref 98–111)
Creatinine, Ser: 0.94 mg/dL (ref 0.44–1.00)
GFR calc Af Amer: >60 mL/min (ref >60)
GFR calc non Af Amer: >60 mL/min (ref >60)
Glucose, Bld: 122 mg/dL — ABNORMAL HIGH (ref 70–99)
Potassium: 4.1 mmol/L (ref 3.5–5.1)
Sodium: 136 mmol/L (ref 135–145)

## 2020-06-16 LAB — CBC
HCT: 36.9 % (ref 36.0–46.0)
Hemoglobin: 11.6 g/dL — ABNORMAL LOW (ref 12.0–15.0)
MCH: 28.4 pg (ref 26.0–34.0)
MCHC: 31.4 g/dL (ref 30.0–36.0)
MCV: 90.2 fL (ref 80.0–100.0)
Platelets: 196 10*3/uL (ref 150–400)
RBC: 4.09 MIL/uL (ref 3.87–5.11)
RDW: 13.6 % (ref 11.5–15.5)
WBC: 8 10*3/uL (ref 4.0–10.5)
nRBC: 0 % (ref 0.0–0.2)

## 2020-06-16 LAB — PROTIME-INR
INR: 1.1 (ref 0.8–1.2)
Prothrombin Time: 13.7 seconds (ref 11.4–15.2)

## 2020-06-16 MED ORDER — POLYETHYLENE GLYCOL 3350 17 G PO PACK: 17.0000 g | PACK | Freq: Every day | ORAL | Status: DC

## 2020-06-16 MED ORDER — OXYCODONE HCL 5 MG PO TABS
5.0000 mg | ORAL_TABLET | Freq: Four times a day (QID) | ORAL | Status: DC | PRN
  Administered 2020-06-16 (×2): 5 mg via ORAL
  Filled 2020-06-16 (×3): qty 1

## 2020-06-16 MED ORDER — SENNOSIDES-DOCUSATE SODIUM 8.6-50 MG PO TABS
1.0000 | ORAL_TABLET | Freq: Two times a day (BID) | ORAL | Status: DC
  Filled 2020-06-16: qty 1

## 2020-06-16 NOTE — Consult Note (Signed)
Urology Consult   Physician requesting consult: Lalla Brothers, MD  Reason for consult: Left hydronephrosis  History of Present Illness: Beth Brock is a 69 y.o. female with known diagnosis of pancreatic cancer with probable recurrence.  She has superior vena cava syndrome, and apparently has elevating serum tumor markers.  She has had, over the past few days, lower abdominal pain in the midline radiating to the left upper quadrant.  Not necessarily colicky in nature or associated with nausea or vomiting.  She has had gross hematuria.  CT scan revealed new onset left hydronephrosis.  Ureter is decompressed.  Urinalysis revealed hematuria without evidence of infection.  She has no prior history of urologic illnesses.  Urologic consultation is requested.   Past Medical History:  Diagnosis Date  . Cancer [facility])    Pancreatic   . Pulmonary embolism [facility])     Past Surgical History:  Procedure Laterality Date  . BREAST EXCISIONAL BIOPSY Left   . BREAST EXCISIONAL BIOPSY Left   . BREAST EXCISIONAL BIOPSY Right   . ESOPHAGOGASTRODUODENOSCOPY N/A 08/23/2018   Procedure: ESOPHAGOGASTRODUODENOSCOPY (EGD);  Surgeon: Carol Ada, MD;  Location: [facility];  Service: Endoscopy;  Laterality: N/A;  . UPPER ESOPHAGEAL ENDOSCOPIC ULTRASOUND (EUS) N/A 08/23/2018   Procedure: UPPER ESOPHAGEAL ENDOSCOPIC ULTRASOUND (EUS);  Surgeon: Carol Ada, MD;  Location: [facility];  Service: Endoscopy;  Laterality: N/A;     Current Hospital Medications: Scheduled Meds: . enoxaparin (LOVENOX) injection  90 mg Subcutaneous Q24H  . lipase/protease/amylase  24,000 Units Oral TID WC  . pantoprazole  80 mg Oral Daily   Continuous Infusions: PRN Meds:.albuterol, ondansetron, oxyCODONE  Allergies:  Allergies  Allergen Reactions  . Eliquis [Apixaban] Swelling    Face swelling   . Iodinated Diagnostic Agents Itching    Family History  Problem Relation Age of Onset  . Breast cancer Mother 25  .  Breast cancer Paternal Grandmother 40    Social History:  reports that she quit smoking about 14 years ago. She has never used smokeless tobacco. She reports current alcohol use. She reports previous drug use.  ROS: A complete review of systems was performed.  All systems are negative except for pertinent findings as noted.  Physical Exam:  Vital signs in last 24 hours: Temp:  [98.1 F (36.7 C)-99.1 F (37.3 C)] 98.6 F (37 C) (09/29 1809) Pulse Rate:  [87-91] 91 (09/29 1809) Resp:  [16] 16 (09/29 1809) BP: (111-129)/(65-76) 126/76 (09/29 1809) SpO2:  [92 %-95 %] 92 % (09/29 1809) Weight:  [59.9 kg] 59.9 kg (09/29 0550) General:  Alert and oriented, No acute distress HEENT: Normocephalic, atraumatic Neck: No JVD or lymphadenopathy Cardiovascular: Regular rate  Lungs: Normal inspiratory/expiratory excursion Extremities: No edema Neurologic: Grossly intact  Laboratory Data:  Recent Labs    06/14/20 1333 06/16/20 0920  WBC 8.2 8.0  HGB 12.7 11.6*  HCT 40.0 36.9  PLT 171 196    Recent Labs    06/14/20 1333 06/16/20 0920  NA 139 136  K 4.1 4.1  CL 104 100  GLUCOSE 103* 122*  BUN 16 8  CALCIUM 10.4* 9.9  CREATININE 1.00 0.94     Results for orders placed or performed during the hospital encounter of 06/14/20 (from the past 24 hour(s))  Basic metabolic panel     Status: Abnormal   Collection Time: 06/16/20  9:20 AM  Result Value Ref Range   Sodium 136 135 - 145 mmol/L   Potassium 4.1 3.5 - 5.1 mmol/L  Chloride 100 98 - 111 mmol/L   CO2 26 22 - 32 mmol/L   Glucose, Bld 122 (H) 70 - 99 mg/dL   BUN 8 8 - 23 mg/dL   Creatinine, Ser 0.94 0.44 - 1.00 mg/dL   Calcium 9.9 8.9 - 10.3 mg/dL   GFR calc non Af Amer >60 >60 mL/min   GFR calc Af Amer >60 >60 mL/min   Anion gap 10 5 - 15  Protime-INR     Status: None   Collection Time: 06/16/20  9:20 AM  Result Value Ref Range   Prothrombin Time 13.7 11.4 - 15.2 seconds   INR 1.1 0.8 - 1.2  CBC     Status: Abnormal    Collection Time: 06/16/20  9:20 AM  Result Value Ref Range   WBC 8.0 4.0 - 10.5 K/uL   RBC 4.09 3.87 - 5.11 MIL/uL   Hemoglobin 11.6 (L) 12.0 - 15.0 g/dL   HCT 36.9 36 - 46 %   MCV 90.2 80.0 - 100.0 fL   MCH 28.4 26.0 - 34.0 pg   MCHC 31.4 30.0 - 36.0 g/dL   RDW 13.6 11.5 - 15.5 %   Platelets 196 150 - 400 K/uL   nRBC 0.0 0.0 - 0.2 %   Recent Results (from the past 240 hour(s))  Urine culture     Status: Abnormal   Collection Time: 06/14/20  1:19 PM   Specimen: Urine, Random  Result Value Ref Range Status   Specimen Description URINE, RANDOM  Final   Special Requests NONE  Final   Culture (A)  Final    <10,000 COLONIES/mL INSIGNIFICANT GROWTH Performed at [facility], [address] [place], [place] [address]    Report Status 06/15/2020 FINAL  Final  Respiratory Panel by RT PCR (Flu A&B, Covid) - Nasopharyngeal Swab     Status: None   Collection Time: 06/15/20  7:57 AM   Specimen: Nasopharyngeal Swab  Result Value Ref Range Status   SARS Coronavirus 2 by RT PCR NEGATIVE NEGATIVE Final    Comment: (NOTE) SARS-CoV-2 target nucleic acids are NOT DETECTED.  The SARS-CoV-2 RNA is generally detectable in upper respiratoy specimens during the acute phase of infection. The lowest concentration of SARS-CoV-2 viral copies this assay can detect is 131 copies/mL. A negative result does not preclude SARS-Cov-2 infection and should not be used as the sole basis for treatment or other patient management decisions. A negative result may occur with  improper specimen collection/handling, submission of specimen other than nasopharyngeal swab, presence of viral mutation(s) within the areas targeted by this assay, and inadequate number of viral copies (<131 copies/mL). A negative result must be combined with clinical observations, patient history, and epidemiological information. The expected result is Negative.  Fact Sheet for Patients:   PinkCheek.be  Fact Sheet for Healthcare Providers:  GravelBags.it  This test is no t yet approved or cleared by the [place] FDA and  has been authorized for detection and/or diagnosis of SARS-CoV-2 by FDA under an Emergency Use Authorization (EUA). This EUA will remain  in effect (meaning this test can be used) for the duration of the COVID-19 declaration under Section 564(b)(1) of the Act, 21 U.S.C. section 360bbb-3(b)(1), unless the authorization is terminated or revoked sooner.     Influenza A by PCR NEGATIVE NEGATIVE Final   Influenza B by PCR NEGATIVE NEGATIVE Final    Comment: (NOTE) The Xpert Xpress SARS-CoV-2/FLU/RSV assay is intended as an aid in  the diagnosis  of influenza from Nasopharyngeal swab specimens and  should not be used as a sole basis for treatment. Nasal washings and  aspirates are unacceptable for Xpert Xpress SARS-CoV-2/FLU/RSV  testing.  Fact Sheet for Patients: PinkCheek.be  Fact Sheet for Healthcare Providers: GravelBags.it  This test is not yet approved or cleared by the [place] FDA and  has been authorized for detection and/or diagnosis of SARS-CoV-2 by  FDA under an Emergency Use Authorization (EUA). This EUA will remain  in effect (meaning this test can be used) for the duration of the  Covid-19 declaration under Section 564(b)(1) of the Act, 21  U.S.C. section 360bbb-3(b)(1), unless the authorization is  terminated or revoked. Performed at [facility], [place] [address], [place], [place] [address]     Renal Function: Recent Labs    06/14/20 1333 06/16/20 0920  CREATININE 1.00 0.94   Estimated Creatinine Clearance: 51.9 mL/min (by C-G formula based on SCr of 0.94 mg/dL).  Radiologic Imaging: CT CHEST ABDOMEN PELVIS W CONTRAST  Result Date: 06/15/2020 CLINICAL DATA:  History of pancreatic cancer and  pulmonary embolus on Xarelto. Swelling of bilateral veins in the chest wall, neck, and arms. Gross hematuria and left flank pain 2 days ago. EXAM: CT CHEST, ABDOMEN, AND PELVIS WITH CONTRAST TECHNIQUE: Multidetector CT imaging of the chest, abdomen and pelvis was performed following the standard protocol during bolus administration of intravenous contrast. CONTRAST:  148mL OMNIPAQUE IOHEXOL 300 MG/ML  SOLN COMPARISON:  CT abdomen and pelvis 07/18/2018 FINDINGS: CT CHEST FINDINGS Cardiovascular: Normal heart size. No pericardial effusions. Normal caliber thoracic aorta with scattered calcifications. No aortic dissection. Central pulmonary arteries are moderately well opacified without evidence of pulmonary embolus. Great vessel origins are patent. Central venous catheter with tip in the superior vena cava. Focal narrowing of the mid/distal SVC. Collateral veins in the right neck. 1.9 cm diameter structure in the right base of the neck with central low-attenuation probably represents a thrombosed right jugular vein. Mediastinum/Nodes: Esophagus is decompressed. No significant lymphadenopathy. Lungs/Pleura: Patchy areas of nodular peripheral scarring in the lung bases bilaterally, likely fibrosis or inflammatory. Metastatic deposits are possible but the appearance is more likely to represent an inflammatory process. Short-term follow-up is suggested. Diffuse emphysematous change in the lungs. No focal consolidation otherwise demonstrated. Airways are patent. No pleural effusions. No pneumothorax. Musculoskeletal: Degenerative changes in the spine. No destructive bone lesions. CT ABDOMEN PELVIS FINDINGS Hepatobiliary: No focal liver abnormality is seen. No gallstones, gallbladder wall thickening, or biliary dilatation. Pancreas: Mild pancreatic ductal dilatation with fullness in the head of the pancreas corresponding to known pancreatic lesion. Effacement of the fat around the celiac axis. Suggestion of focal narrowing  of the distal superior mesenteric vein with possible focal thrombosis versus unopacified blood return. This is developing since the previous study. There is a cyst at the junction of the body and tail of the pancreas measuring 1.1 cm diameter, similar to prior study. Spleen: Normal in size without focal abnormality. Adrenals/Urinary Tract: No adrenal gland nodules. Kidneys are symmetrical with homogeneous nephrograms. 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 No stones identified. Bladder is normal. Stomach/Bowel: Stomach is within normal limits. Appendix appears normal. No evidence of bowel wall thickening, distention, or inflammatory changes. Vascular/Lymphatic: Calcification of the aorta. No retroperitoneal lymphadenopathy. Reproductive: Status post hysterectomy. No adnexal masses. Other: No free air or free fluid in the abdomen. Abdominal wall musculature appears intact. Musculoskeletal: Degenerative changes in the spine. No destructive bone lesions. IMPRESSION:  1. Patchy areas of nodular peripheral scarring in the lung bases bilaterally, new since prior study, likely fibrosis or inflammatory. Metastatic deposits are possible but the appearance is more likely to represent an inflammatory process. Short-term follow-up is suggested. 2. Diffuse emphysematous change in the lungs. 3. Focal narrowing of the mid/distal SVC with multiple collateral veins in the right neck. 4. 1.9 cm diameter structure in the right base of the neck with central low-attenuation probably represents a thrombosed right jugular vein. 5. Mild pancreatic ductal dilatation with fullness in the head of the pancreas corresponding to known pancreatic lesion. 6. Effacement of the fat around the celiac axis. 7. Suggestion of focal narrowing of the distal superior mesenteric vein with possible focal thrombosis versus unopacified blood return. This is developing since the previous study. 8. 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 No stones identified. 9. 1.1 cm cyst at the junction of the body and tail of the pancreas, similar to prior study. 10. Emphysema and aortic atherosclerosis. Aortic Atherosclerosis (ICD10-I70.0) and Emphysema (ICD10-J43.9). Electronically Signed   By: Lucienne Capers M.D.   On: 06/15/2020 02:59    I independently reviewed the above imaging studies.  Prior CT scans did not reveal any evidence of left hydronephrosis.  Impression/Assessment:  New onset left hydronephrosis with significant pain.  Pain may be associated with this process.  With her history of probable recurrent pancreatic cancer, I would assume that this is most likely the etiologic agent although no mass is seen in the region of the left UPJ.  Plan:  I discussed management of this hydronephrosis with the patient and her daughter, Lorrin Goodell.  I did let her know that recurrent pancreatic cancer may well be the causative factor here.  As she is significantly symptomatic, I would recommend urgent cystoscopy and double-J stent placement.  I will see about scheduling this as an add-on tomorrow afternoon here at [facility]  I discussed the procedure with the patient and her daughter and they desire to proceed.

## 2020-06-16 NOTE — Progress Notes (Signed)
Called by internal medicine service this afternoon.  Dr. Lajuan Lines states that the [facility] oncology service is no longer going to use patient's right-sided Port-A-Cath.  We will proceed with removal of that on Friday.  I will discuss this with the patient further tomorrow.  Ruta Hinds, MD [facility] [place] Office: [phone]

## 2020-06-16 NOTE — Progress Notes (Addendum)
Subjective: Patient had no overnight events. This AM she is reporting new pain with swallowing and slight laughter to the left side of her neck. She also reports 10/10 which she equates to giving birth pain in her abdomen. She reports that this abdomen pain only became this severe this AM. The pain is in her mid left abdomen and radiates to the Right.   Objective:  Vital signs in last 24 hours: Vitals:   06/15/20 1254 06/15/20 1506 06/15/20 1706 06/16/20 0550  BP: 119/73  124/66 129/65  Pulse: 97  83 87  Resp: 16  16 16   Temp: 98 F (36.7 C)  99.4 F (37.4 C) 99.1 F (37.3 C)  TempSrc: Oral  Oral Oral  SpO2: 96%  95% 92%  Weight:  58.7 kg  59.9 kg  Height:       Physical Exam Constitutional:      General: She is not in acute distress. HENT:     Head: Normocephalic.  Neck:     Comments: Distended collateral veins across the upper chest and some JVD distension  Patient is tender to her own touch on the Left side of her neck  Has obvious discomfort in her neck when she chuckles at times during the interview Cardiovascular:     Rate and Rhythm: Regular rhythm. Tachycardia present.  Pulmonary:     Effort: Pulmonary effort is normal.     Breath sounds: Normal breath sounds.  Abdominal:     General: Bowel sounds are normal.     Palpations: Abdomen is soft.     Comments: Patient is not tender to palpation  Musculoskeletal:     Cervical back: Tenderness present.  Neurological:     Mental Status: She is alert.     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 Imaging shows a right IJ thrombus present since at least early August and narrowing of the distal SVC associated with a right  Port-A-Cath. No abutting thoracic tumor. Greatly appreciate vascular surgery consultation. We spoke with her outpatient oncologist, it is acceptable to remove the Port-A-Cath at this time from the perspective of her cancer treatments. This may relieve her symptoms of neck pain, swallowing pain, and facial swelling, though this will take several weeks to improve. - Working to remove Port-A-Cath  Superior Mesenteric Vein thrombosis CT noted focal narrowing of the distal superior mesenteric vein with possible focal thrombosis versus malignancy associated. Abdominal pain may be related to this. She is at risk for a hypercoagulable state due to pancreatic cancer, and certainly is high risk if this thrombus developed while she was taking Xarelto. - Anticoagulate with Lovenox 60 mg twice daily for at least 4 weeks. - Can follow-up as an outpatient regarding long-term strategy of anticoagulation, depends on what happens with her cancer prognosis.  Left hydronephrosis with hematuria Moderate left-sided hydronephrosis seen on CT abdomen, is associated with ongoing hematuria since about Saturday. She has a nonspecific abdominal pain which may be related to the hydronephrosis versus the SMV thrombus. No active nephrolithiasis seen. No signs of pyelonephritis or other urinary tract infection. Given the likely clinical progression of her pancreatic cancer, seems likely that the ureter is being compressed related to malignancy. We will consult with  urology to consider ureteral stenting. Given that this is malignancy associated, seems at risk for progression if it is not intervened on.   Pancreatic cancer: Suspected pancreatic adenocarcinoma based on imaging findings and continued rise in the patient's CA 19-9, underwent multiple biopsy attempts however specific cytopathologic diagnosis has not been achieved.  Managed by [facility], not currently on chemotherapy.  Last saw oncology on 8/10, was not tolerating  capecitabine well, holding off on further doses at this time until follow-up on October 5.    Prior to Admission Living Arrangement: Home Anticipated Discharge Location: Home Barriers to Discharge: Treatment Dispo: Anticipated discharge in approximately 1-2 day(s).   Freida Busman, MD 06/16/2020, 6:40 AM Pager: [phone] After 5pm on weekdays and 1pm on weekends: On Call pager [phone]

## 2020-06-16 NOTE — Hospital Course (Addendum)
This is a 69 year old female with a history of pancreatic cancer, pulmonary embolism on Xarelto, COPD, diverticulitis, hyperlipidemia, and osteoporosis presenting with left neck pain and swelling of neck veins.   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 Patient reported she was beginning to have pain swallowing and when attempting to laugh. CT chest abdomen pelvis revealed focal narrowing of the mid distal SVC with multiple collateral veins in the neck. Vascular concerned that the patient's Right sided Port-A-cath was causing her SVC. Port- A- Cath was removed and patient will follow-up with Vascular outpatient.  Superior Mesenteric Vein thrombosis Patient was noted to have abdominal pain at presentation. CT Chest Abdomen Pelvis noted focal narrowing of the distal superior mesenteric vein with possible focal thrombosis despite being on a NOAC. Patient was started on Lovenox. Pharmacy was consulted and patient was started on 60mg  Lovenox BID injections. Vascular surgery was consulted and felt that patient needed lifelong therapeutic lovenox. Pharmacy was consulted and patient was placed on 90mg  daily Lovenox on which she was discharged with.   Left hydronephrosis Patient presented reporting significant abdominal pain. CT Chest Abdomen Pelvis noted that patient had left hydronephrosis 2/2 UPJ obstruction but no stones were identified. Urology was consulted who performed urgent cystoscopy and placed double J stent in the Left ureter. Patient had decreased abdominal pain after procedure and was urinating well.   Pancreatic cancer: Spoke with patient's oncologist, Dr. Earnestine Mealing. He felt that it was appropriate to remove the Port-A-Cath as the patient was on a chemotherapy holiday. He reported that if patient requires a new port for her chemo later they will place in the Upper  left chest instead. CT Abdomen Chest Pelvis noted possible metastatic deposits in the lungs vs inflammatory process.

## 2020-06-17 ENCOUNTER — Inpatient Hospital Stay (HOSPITAL_COMMUNITY): Payer: Medicare HMO | Admitting: Anesthesiology

## 2020-06-17 ENCOUNTER — Inpatient Hospital Stay (HOSPITAL_COMMUNITY): Payer: Medicare HMO

## 2020-06-17 ENCOUNTER — Encounter (HOSPITAL_COMMUNITY): Payer: Self-pay | Admitting: Student in an Organized Health Care Education/Training Program

## 2020-06-17 ENCOUNTER — Encounter (HOSPITAL_COMMUNITY)
Admission: EM | Disposition: A | Discharge status: Home / Self Care | Payer: Self-pay | Source: Home / Self Care | Attending: Student in an Organized Health Care Education/Training Program

## 2020-06-17 HISTORY — PX: CYSTOSCOPY W/ URETERAL STENT PLACEMENT: SHX1429

## 2020-06-17 LAB — CBC WITH DIFFERENTIAL/PLATELET
Abs Immature Granulocytes: 0.02 10*3/uL (ref 0.00–0.07)
Basophils Absolute: 0 10*3/uL (ref 0.0–0.1)
Basophils Relative: 0 %
Eosinophils Absolute: 0.1 10*3/uL (ref 0.0–0.5)
Eosinophils Relative: 1 %
HCT: 35.6 % — ABNORMAL LOW (ref 36.0–46.0)
Hemoglobin: 11.2 g/dL — ABNORMAL LOW (ref 12.0–15.0)
Immature Granulocytes: 0 %
Lymphocytes Relative: 22 %
Lymphs Abs: 1.4 10*3/uL (ref 0.7–4.0)
MCH: 28.4 pg (ref 26.0–34.0)
MCHC: 31.5 g/dL (ref 30.0–36.0)
MCV: 90.1 fL (ref 80.0–100.0)
Monocytes Absolute: 0.8 10*3/uL (ref 0.1–1.0)
Monocytes Relative: 12 %
Neutro Abs: 4.1 10*3/uL (ref 1.7–7.7)
Neutrophils Relative %: 65 %
Platelets: 187 10*3/uL (ref 150–400)
RBC: 3.95 MIL/uL (ref 3.87–5.11)
RDW: 13.4 % (ref 11.5–15.5)
WBC: 6.5 10*3/uL (ref 4.0–10.5)
nRBC: 0 % (ref 0.0–0.2)

## 2020-06-17 LAB — BASIC METABOLIC PANEL
Anion gap: 12 (ref 5–15)
BUN: 8 mg/dL (ref 8–23)
CO2: 23 mmol/L (ref 22–32)
Calcium: 9.8 mg/dL (ref 8.9–10.3)
Chloride: 101 mmol/L (ref 98–111)
Creatinine, Ser: 0.8 mg/dL (ref 0.44–1.00)
GFR calc Af Amer: >60 mL/min (ref >60)
GFR calc non Af Amer: >60 mL/min (ref >60)
Glucose, Bld: 94 mg/dL (ref 70–99)
Potassium: 4.1 mmol/L (ref 3.5–5.1)
Sodium: 136 mmol/L (ref 135–145)

## 2020-06-17 LAB — MRSA PCR SCREENING: MRSA by PCR: NEGATIVE

## 2020-06-17 SURGERY — CYSTOSCOPY, WITH RETROGRADE PYELOGRAM AND URETERAL STENT INSERTION
Anesthesia: General | Laterality: Left

## 2020-06-17 MED ORDER — FENTANYL CITRATE (PF) 250 MCG/5ML IJ SOLN
INTRAMUSCULAR | Status: AC
  Filled 2020-06-17: qty 5

## 2020-06-17 MED ORDER — CHLORHEXIDINE GLUCONATE 0.12 % MT SOLN
15.0000 mL | OROMUCOSAL | Status: AC
  Filled 2020-06-17: qty 15

## 2020-06-17 MED ORDER — LIDOCAINE 2% (20 MG/ML) 5 ML SYRINGE
INTRAMUSCULAR | Status: DC | PRN
  Administered 2020-06-17: 60 mg via INTRAVENOUS

## 2020-06-17 MED ORDER — LIDOCAINE 2% (20 MG/ML) 5 ML SYRINGE
INTRAMUSCULAR | Status: AC
  Filled 2020-06-17: qty 5

## 2020-06-17 MED ORDER — PROPOFOL 10 MG/ML IV BOLUS
INTRAVENOUS | Status: DC | PRN
  Administered 2020-06-17: 100 mg via INTRAVENOUS

## 2020-06-17 MED ORDER — DEXAMETHASONE SODIUM PHOSPHATE 10 MG/ML IJ SOLN
INTRAMUSCULAR | Status: DC | PRN
  Administered 2020-06-17: 4 mg via INTRAVENOUS

## 2020-06-17 MED ORDER — SUGAMMADEX SODIUM 200 MG/2ML IV SOLN
INTRAVENOUS | Status: DC | PRN
  Administered 2020-06-17: 200 mg via INTRAVENOUS

## 2020-06-17 MED ORDER — CEFAZOLIN SODIUM-DEXTROSE 2-4 GM/100ML-% IV SOLN
2.0000 g | Freq: Once | INTRAVENOUS | Status: AC
  Administered 2020-06-17: 2 g via INTRAVENOUS
  Filled 2020-06-17: qty 100

## 2020-06-17 MED ORDER — ONDANSETRON HCL 4 MG/2ML IJ SOLN
INTRAMUSCULAR | Status: AC
  Filled 2020-06-17: qty 2

## 2020-06-17 MED ORDER — DEXTROSE-NACL 5-0.45 % IV SOLN: INTRAVENOUS | Status: AC

## 2020-06-17 MED ORDER — LACTATED RINGERS IV SOLN: INTRAVENOUS | Status: DC

## 2020-06-17 MED ORDER — ONDANSETRON HCL 4 MG/2ML IJ SOLN
INTRAMUSCULAR | Status: DC | PRN
  Administered 2020-06-17: 4 mg via INTRAVENOUS

## 2020-06-17 MED ORDER — ROCURONIUM BROMIDE 10 MG/ML (PF) SYRINGE
PREFILLED_SYRINGE | INTRAVENOUS | Status: DC | PRN
  Administered 2020-06-17: 40 mg via INTRAVENOUS

## 2020-06-17 MED ORDER — MIDAZOLAM HCL 2 MG/2ML IJ SOLN
INTRAMUSCULAR | Status: AC
  Filled 2020-06-17: qty 2

## 2020-06-17 MED ORDER — DEXAMETHASONE SODIUM PHOSPHATE 10 MG/ML IJ SOLN
INTRAMUSCULAR | Status: AC
  Filled 2020-06-17: qty 1

## 2020-06-17 MED ORDER — WATER FOR IRRIGATION, STERILE IR SOLN
Status: DC | PRN
  Administered 2020-06-17: 100 mL via SURGICAL_CAVITY

## 2020-06-17 MED ORDER — ROCURONIUM BROMIDE 10 MG/ML (PF) SYRINGE
PREFILLED_SYRINGE | INTRAVENOUS | Status: AC
  Filled 2020-06-17: qty 10

## 2020-06-17 MED ORDER — BELLADONNA ALKALOIDS-OPIUM 16.2-30 MG RE SUPP
1.0000 | RECTAL | Status: DC
  Filled 2020-06-17: qty 1

## 2020-06-17 MED ORDER — MIDAZOLAM HCL 5 MG/5ML IJ SOLN
INTRAMUSCULAR | Status: DC | PRN
  Administered 2020-06-17: 2 mg via INTRAVENOUS

## 2020-06-17 MED ORDER — IOHEXOL 300 MG/ML  SOLN
INTRAMUSCULAR | Status: DC | PRN
  Administered 2020-06-17: 15 mL

## 2020-06-17 MED ORDER — PROPOFOL 10 MG/ML IV BOLUS
INTRAVENOUS | Status: AC
  Filled 2020-06-17: qty 20

## 2020-06-17 MED ORDER — FENTANYL CITRATE (PF) 100 MCG/2ML IJ SOLN
INTRAMUSCULAR | Status: DC | PRN
Start: 2020-06-17 — End: 2020-06-17
  Administered 2020-06-17: 50 ug via INTRAVENOUS

## 2020-06-17 MED ORDER — CHLORHEXIDINE GLUCONATE 0.12 % MT SOLN
OROMUCOSAL | Status: AC
  Administered 2020-06-17: 15 mL via OROMUCOSAL
  Filled 2020-06-17: qty 15

## 2020-06-17 SURGICAL SUPPLY — 17 items
BAG URINE DRAIN 2000ML AR STRL (UROLOGICAL SUPPLIES) ×3 IMPLANT
BAG URO CATCHER STRL LF (MISCELLANEOUS) ×3 IMPLANT
CATH INTERMIT  6FR 70CM (CATHETERS) ×3 IMPLANT
GLOVE BIOGEL M STRL SZ7.5 (GLOVE) ×3 IMPLANT
GOWN STRL REUS W/ TWL LRG LVL3 (GOWN DISPOSABLE) ×1 IMPLANT
GOWN STRL REUS W/ TWL XL LVL3 (GOWN DISPOSABLE) ×1 IMPLANT
GOWN STRL REUS W/TWL LRG LVL3 (GOWN DISPOSABLE) ×2
GOWN STRL REUS W/TWL XL LVL3 (GOWN DISPOSABLE) ×2
KIT TURNOVER KIT B (KITS) ×3 IMPLANT
MANIFOLD NEPTUNE II (INSTRUMENTS) IMPLANT
PACK CYSTO (CUSTOM PROCEDURE TRAY) ×3 IMPLANT
STENT URET 6FRX24 CONTOUR (STENTS) ×3 IMPLANT
STENT URET 6FRX26 CONTOUR (STENTS) IMPLANT
TOWEL GREEN STERILE FF (TOWEL DISPOSABLE) ×3 IMPLANT
TUBE CONNECTING 12'X1/4 (SUCTIONS)
TUBE CONNECTING 12X1/4 (SUCTIONS) IMPLANT
WATER STERILE IRR 3000ML UROMA (IV SOLUTION) ×3 IMPLANT

## 2020-06-17 NOTE — Anesthesia Preprocedure Evaluation (Signed)
Anesthesia Evaluation    Reviewed: Allergy & Precautions, Patient's Chart, lab work & pertinent test results, Unable to perform ROS - Chart review only  History of Anesthesia Complications Negative for: history of anesthetic complications  Airway Mallampati: II  TM Distance: >3 FB Neck ROM: Full    Dental  (+) Dental Advisory Given, Partial Lower   Pulmonary former smoker,    Pulmonary exam normal breath sounds clear to auscultation       Cardiovascular negative cardio ROS Normal cardiovascular exam Rhythm:Regular Rate:Normal     Neuro/Psych negative neurological ROS     GI/Hepatic Neg liver ROS, GERD  Medicated,Pancreatic mass   Endo/Other  negative endocrine ROS  Renal/GU Renal disease     Musculoskeletal negative musculoskeletal ROS (+)   Abdominal   Peds  Hematology negative hematology ROS (+)   Anesthesia Other Findings Day of surgery medications reviewed with the patient.  Reproductive/Obstetrics                            Anesthesia Physical  Anesthesia Plan  ASA: III  Anesthesia Plan: General   Post-op Pain Management:    Induction: Intravenous  PONV Risk Score and Plan: 2 and Treatment may vary due to age or medical condition and Propofol infusion  Airway Management Planned: Oral ETT  Additional Equipment: None  Intra-op Plan:   Post-operative Plan: Extubation in OR  Informed Consent: I have reviewed the patients History and Physical, chart, labs and discussed the procedure including the risks, benefits and alternatives for the proposed anesthesia with the patient or authorized representative who has indicated his/her understanding and acceptance.     Dental advisory given  Plan Discussed with: CRNA  Anesthesia Plan Comments:        Anesthesia Quick Evaluation

## 2020-06-17 NOTE — Progress Notes (Signed)
Pt seen and examined no real change in swelling from 48 hr ago.  D/w her removal of portacath in OR tomorrow.  Risk of PE discussed overall fairly low.  Also risk of infection less than 1%.  Potentially would do central venogram but most likely defer this for now and see how symptoms evolve after port removal  NPO p midnight Consent Hold lovenox today She will need lifelong anticoagulation most likely therapeutic lovenox  Beth Hinds, MD [facility] Office: [phone]

## 2020-06-17 NOTE — Anesthesia Procedure Notes (Signed)
Procedure Name: Intubation Date/Time: 06/17/2020 4:36 PM Performed by: Moshe Salisbury, CRNA Pre-anesthesia Checklist: Patient identified, Emergency Drugs available, Suction available and Patient being monitored Patient Re-evaluated:Patient Re-evaluated prior to induction Oxygen Delivery Method: Circle System Utilized Preoxygenation: Pre-oxygenation with 100% oxygen Induction Type: IV induction Ventilation: Mask ventilation without difficulty Laryngoscope Size: Mac and 3 Grade View: Grade II Tube type: Oral Tube size: 7.5 mm Number of attempts: 1 Airway Equipment and Method: Stylet and Oral airway Placement Confirmation: ETT inserted through vocal cords under direct vision,  positive ETCO2 and breath sounds checked- equal and bilateral Secured at: 22 cm Tube secured with: Tape Dental Injury: Teeth and Oropharynx as per pre-operative assessment

## 2020-06-17 NOTE — Anesthesia Preprocedure Evaluation (Signed)
Anesthesia Evaluation    Reviewed: Allergy & Precautions, Patient's Chart, lab work & pertinent test results, Unable to perform ROS - Chart review only  History of Anesthesia Complications Negative for: history of anesthetic complications  Airway Mallampati: II  TM Distance: >3 FB Neck ROM: Full    Dental  (+) Dental Advisory Given, Partial Lower   Pulmonary former smoker,    Pulmonary exam normal breath sounds clear to auscultation       Cardiovascular negative cardio ROS Normal cardiovascular exam Rhythm:Regular Rate:Normal     Neuro/Psych negative neurological ROS     GI/Hepatic Neg liver ROS, GERD  Medicated,Pancreatic mass   Endo/Other  negative endocrine ROS  Renal/GU Renal disease     Musculoskeletal negative musculoskeletal ROS (+)   Abdominal   Peds  Hematology negative hematology ROS (+)   Anesthesia Other Findings Day of surgery medications reviewed with the patient.  Reproductive/Obstetrics                             Anesthesia Physical  Anesthesia Plan  ASA: III  Anesthesia Plan:    Post-op Pain Management:    Induction: Intravenous  PONV Risk Score and Plan: 2 and Treatment may vary due to age or medical condition  Airway Management Planned:   Additional Equipment: None  Intra-op Plan:   Post-operative Plan:   Informed Consent:   Plan Discussed with:   Anesthesia Plan Comments:         Anesthesia Quick Evaluation

## 2020-06-17 NOTE — Op Note (Signed)
Preoperative diagnosis: Probable malignant left hydronephrosis  Postoperative diagnosis: Same  Principal procedure: Cystoscopy, left retrograde ureteropyelogram, fluoroscopic interpretation, placement of 6 French by 24 cm contour double-J stent without tether  Surgeon: Mattthew Ziomek  Anesthesia: General with LMA  Complications: None  Drains: None  Estimated blood loss: None  Indications: 69 year old female with recurrent pancreatic cancer.  She presented to the health system a couple of days ago with abdominal pain, worse on the left side.  CT of the abdomen and pelvis revealed new onset left hydronephrosis.  Transition point was just at the left UPJ.  It was felt that this may be the cause of the patient's pain, and with its new onset and the fact that she more than likely will be getting further chemotherapy, I recommended cystoscopy and stent placement.  I have discussed the procedure with the patient and her daughter.  They understand the procedure, the risks involved which are minimal, and desire to proceed.  Description of procedure: The patient was properly identified in the holding area.  She received preoperative IV antibiotics.  She was taken to the operating room where general anesthetic was administered with the LMA.  She was placed in the dorsolithotomy position.  Genitalia and perineum were prepped and draped, proper timeout performed.  21 French panendoscope advanced into the bladder.  Circumferential inspection of the bladder was performed and no lesions were seen.  Ureteral orifice ease were somewhat obscured but normal in their location.  The left ureteral orifice was cannulated with a 6 [place] open-ended catheter with the assistance of a sensor tip guidewire.  Retrograde pyelogram was performed using Omnipaque.  This revealed a normal ureter throughout, but at the ureteropelvic junction there was a transition point with significant narrowing.  There was no evidence of filling  defects within the pyelocalyceal system, which was somewhat dilated.  Following this, sensor tip guidewire was easily advanced into the upper pole calyceal system.  The open-ended catheter was removed and then placed a 24 cm x 6 French contour double-J stent.  Once the guidewire was removed, excellent proximal and distal curls were seen in the left renal pelvis and the bladder, respectively.  The bladder was drained.  The scope was removed and the procedure terminated.  The patient was awakened and taken to the PACU in stable condition.  She tolerated the procedure well.

## 2020-06-17 NOTE — Progress Notes (Addendum)
Subjective: The patient is doing ok this AM. She reported so slight annoyance at the fact that she has been NPO since midnight because there was confusion around her procedure time. She is pleasant overall, but reports that she is thirsty and wishes she had been able to drink water a bit longer before being NPO. Otherwise patient had no events overnight. She continues to report pain when swallowing and laughing on the left side of her neck. She also reports that the area is tender.   Objective:  Vital signs in last 24 hours: Vitals:   06/16/20 1226 06/16/20 1809 06/17/20 0018 06/17/20 0518  BP: 111/67 126/76 121/62 125/68  Pulse: 87 91 94 92  Resp: 16 16 18 18   Temp: 98.1 F (36.7 C) 98.6 F (37 C) 99.4 F (37.4 C) 99.4 F (37.4 C)  TempSrc: Oral Oral Oral Oral  SpO2: 95% 92% 94% 94%  Weight:    59.2 kg  Height:       Physical Exam Constitutional:      Appearance: Normal appearance.  HENT:     Head: Normocephalic and atraumatic.  Neck:     Comments: Tender to touch on the left side in a localized area. Patient also has distended neck and collateral veins on her chest.  Mild erythema with no warmth or tenderness at the base if her neck Cardiovascular:     Rate and Rhythm: Normal rate and regular rhythm.  Pulmonary:     Effort: Pulmonary effort is normal.     Breath sounds: Normal breath sounds.  Abdominal:     General: Bowel sounds are normal.     Palpations: Abdomen is soft.  Musculoskeletal:     Cervical back: Tenderness present.  Skin:    General: Skin is warm and dry.  Neurological:     Mental Status: She is alert.  Psychiatric:        Mood and Affect: Mood normal.     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 She continues to have the left sided neck pain with swallowing and laughter. She also continues to have tenderness to palpation on the same side.  - Port-A-cath removal tomorrow - NPO after midnight   Superior Mesenteric Vein thrombosis CT noted focal narrowing of the distal superior mesenteric vein with possible focal thrombosis versus malignancy associated. Abdominal pain may be related to this. She is at risk for a hypercoagulable state due to pancreatic cancer, and certainly is high risk if this thrombus developed while she was taking Xarelto. - Treating with lovenox, though on hold today for tomorrow's vascular surgery  Left hydronephrosis with hematuria Likely due to pancreatic cancer causing compression of the ureter. Greatly appreciate urology consultation, planning for cystoscopy and double J stent placement today.  - Stent placement today - D5 1/2NS for hydration  Pancreatic cancer: Suspected pancreatic adenocarcinoma based on imaging findings and continued rise in the patient's CA 19-9, underwent multiple biopsy attempts however specific cytopathologic diagnosis has not been achieved.  Managed by [facility],  not currently on chemotherapy.  Last saw oncology on 8/10, was not tolerating capecitabine well, holding off on further doses at this time until follow-up on October 5.   Prior to Admission Living Arrangement: Home Anticipated Discharge Location: Home Barriers to Discharge: Treatment Dispo: Anticipated discharge in approximately 1-2 day(s).   Freida Busman, MD 06/17/2020, 7:17 AM Pager: [phone] After 5pm on weekdays and 1pm on weekends: On Call pager [phone]

## 2020-06-17 NOTE — Progress Notes (Addendum)
Received pt from pacu. This rn arrived to room to find pt in bathroom. Pt GCS 15, 2/10 left neck pain. Pt reports feeling need to void. Upper left lip with local swelling, nasal congestion noted, no tongue swelling, lungs clear to auscultation, speech clear. Ice pack applied to lip, vss. Paged and informed internal med.

## 2020-06-17 NOTE — Transfer of Care (Signed)
Immediate Anesthesia Transfer of Care Note  Patient: Beth Brock  Procedure(s) Performed: CYSTOSCOPY WITHLEFT  RETROGRADE PYELOGRAM AND LEFT DOUBLE J  STENT PLACEMENT (Left )  Patient Location: PACU  Anesthesia Type:General  Level of Consciousness: drowsy and patient cooperative  Airway & Oxygen Therapy: Patient Spontanous Breathing and Patient connected to nasal cannula oxygen  Post-op Assessment: Report given to RN, Post -op Vital signs reviewed and stable and Patient moving all extremities  Post vital signs: Reviewed and stable  Last Vitals:  Vitals Value Taken Time  BP 151/82 06/17/20 1715  Temp    Pulse 85 06/17/20 1719  Resp 17 06/17/20 1719  SpO2 100 % 06/17/20 1719  Vitals shown include unvalidated device data.  Last Pain:  Vitals:   06/17/20 1143  TempSrc: Oral  PainSc:       Patients Stated Pain Goal: 3 (39/68/86 [id number])  Complications: No complications documented.

## 2020-06-17 NOTE — Progress Notes (Signed)
Report called to nurse Eulis Canner. Pt GCS 15, pt transported by transporters to procedural department for cystoscopy

## 2020-06-18 ENCOUNTER — Inpatient Hospital Stay (HOSPITAL_COMMUNITY): Payer: Medicare HMO | Admitting: Anesthesiology

## 2020-06-18 ENCOUNTER — Inpatient Hospital Stay (HOSPITAL_COMMUNITY): Payer: Medicare HMO

## 2020-06-18 ENCOUNTER — Encounter (HOSPITAL_COMMUNITY)
Admission: EM | Disposition: A | Discharge status: Home / Self Care | Payer: Self-pay | Source: Home / Self Care | Attending: Student in an Organized Health Care Education/Training Program

## 2020-06-18 ENCOUNTER — Encounter (HOSPITAL_COMMUNITY): Payer: Self-pay | Admitting: Student in an Organized Health Care Education/Training Program

## 2020-06-18 DIAGNOSIS — I871 Compression of vein: Secondary | ICD-10-CM

## 2020-06-18 HISTORY — PX: PORT-A-CATH REMOVAL: SHX5289

## 2020-06-18 LAB — RENAL FUNCTION PANEL
Albumin: 3.2 g/dL — ABNORMAL LOW (ref 3.5–5.0)
Anion gap: 8 (ref 5–15)
BUN: 9 mg/dL (ref 8–23)
CO2: 27 mmol/L (ref 22–32)
Calcium: 9.8 mg/dL (ref 8.9–10.3)
Chloride: 102 mmol/L (ref 98–111)
Creatinine, Ser: 0.74 mg/dL (ref 0.44–1.00)
GFR calc Af Amer: >60 mL/min (ref >60)
GFR calc non Af Amer: >60 mL/min (ref >60)
Glucose, Bld: 166 mg/dL — ABNORMAL HIGH (ref 70–99)
Phosphorus: 3.1 mg/dL (ref 2.5–4.6)
Potassium: 4.2 mmol/L (ref 3.5–5.1)
Sodium: 137 mmol/L (ref 135–145)

## 2020-06-18 LAB — CBC WITH DIFFERENTIAL/PLATELET
Abs Immature Granulocytes: 0.01 10*3/uL (ref 0.00–0.07)
Basophils Absolute: 0 10*3/uL (ref 0.0–0.1)
Basophils Relative: 0 %
Eosinophils Absolute: 0 10*3/uL (ref 0.0–0.5)
Eosinophils Relative: 0 %
HCT: 36.7 % (ref 36.0–46.0)
Hemoglobin: 11.4 g/dL — ABNORMAL LOW (ref 12.0–15.0)
Immature Granulocytes: 0 %
Lymphocytes Relative: 17 %
Lymphs Abs: 1.1 10*3/uL (ref 0.7–4.0)
MCH: 28.1 pg (ref 26.0–34.0)
MCHC: 31.1 g/dL (ref 30.0–36.0)
MCV: 90.4 fL (ref 80.0–100.0)
Monocytes Absolute: 0.6 10*3/uL (ref 0.1–1.0)
Monocytes Relative: 9 %
Neutro Abs: 4.5 10*3/uL (ref 1.7–7.7)
Neutrophils Relative %: 74 %
Platelets: 191 10*3/uL (ref 150–400)
RBC: 4.06 MIL/uL (ref 3.87–5.11)
RDW: 13.2 % (ref 11.5–15.5)
WBC: 6.1 10*3/uL (ref 4.0–10.5)
nRBC: 0 % (ref 0.0–0.2)

## 2020-06-18 SURGERY — REMOVAL PORT-A-CATH
Anesthesia: Monitor Anesthesia Care | Site: Chest | Laterality: Right

## 2020-06-18 MED ORDER — ONDANSETRON HCL 4 MG/2ML IJ SOLN: 4.0000 mg | Freq: Once | INTRAMUSCULAR | Status: DC | PRN

## 2020-06-18 MED ORDER — ORAL CARE MOUTH RINSE: 15.0000 mL | Freq: Once | OROMUCOSAL | Status: AC

## 2020-06-18 MED ORDER — LIDOCAINE 2% (20 MG/ML) 5 ML SYRINGE
INTRAMUSCULAR | Status: AC
  Filled 2020-06-18: qty 5

## 2020-06-18 MED ORDER — MIDAZOLAM HCL 5 MG/5ML IJ SOLN
INTRAMUSCULAR | Status: DC | PRN
  Administered 2020-06-18: 1 mg via INTRAVENOUS

## 2020-06-18 MED ORDER — SODIUM CHLORIDE 0.9 % IV SOLN
INTRAVENOUS | Status: DC | PRN
  Administered 2020-06-18: 11:00:00 500 mL

## 2020-06-18 MED ORDER — CHLORHEXIDINE GLUCONATE 0.12 % MT SOLN
OROMUCOSAL | Status: AC
  Administered 2020-06-18: 15 mL via OROMUCOSAL
  Filled 2020-06-18: qty 15

## 2020-06-18 MED ORDER — FENTANYL CITRATE (PF) 250 MCG/5ML IJ SOLN
INTRAMUSCULAR | Status: AC
  Filled 2020-06-18: qty 5

## 2020-06-18 MED ORDER — CHLORHEXIDINE GLUCONATE 0.12 % MT SOLN: 15.0000 mL | Freq: Once | OROMUCOSAL | Status: AC

## 2020-06-18 MED ORDER — PROPOFOL 500 MG/50ML IV EMUL
INTRAVENOUS | Status: DC | PRN
  Administered 2020-06-18: 75 ug/kg/min via INTRAVENOUS

## 2020-06-18 MED ORDER — LIDOCAINE HCL 1 % IJ SOLN
INTRAMUSCULAR | Status: DC | PRN
  Administered 2020-06-18: 31 mL

## 2020-06-18 MED ORDER — FENTANYL CITRATE (PF) 100 MCG/2ML IJ SOLN: 25.0000 ug | INTRAMUSCULAR | Status: DC | PRN

## 2020-06-18 MED ORDER — LACTATED RINGERS IV SOLN: INTRAVENOUS | Status: DC

## 2020-06-18 MED ORDER — SODIUM CHLORIDE 0.9 % IV SOLN
INTRAVENOUS | Status: AC
  Filled 2020-06-18: qty 1.2

## 2020-06-18 MED ORDER — 0.9 % SODIUM CHLORIDE (POUR BTL) OPTIME
TOPICAL | Status: DC | PRN
  Administered 2020-06-18: 1000 mL

## 2020-06-18 MED ORDER — LIDOCAINE HCL (PF) 1 % IJ SOLN
INTRAMUSCULAR | Status: AC
  Filled 2020-06-18: qty 30

## 2020-06-18 MED ORDER — ONDANSETRON HCL 4 MG/2ML IJ SOLN
INTRAMUSCULAR | Status: DC | PRN
  Administered 2020-06-18: 4 mg via INTRAVENOUS

## 2020-06-18 MED ORDER — ENOXAPARIN SODIUM 100 MG/ML ~~LOC~~ SOLN
90.0000 mg | SUBCUTANEOUS | Status: DC
  Administered 2020-06-18: 90 mg via SUBCUTANEOUS
  Filled 2020-06-18 (×2): qty 1

## 2020-06-18 MED ORDER — DEXAMETHASONE SODIUM PHOSPHATE 10 MG/ML IJ SOLN
INTRAMUSCULAR | Status: DC | PRN
  Administered 2020-06-18: 5 mg via INTRAVENOUS

## 2020-06-18 MED ORDER — DEXAMETHASONE SODIUM PHOSPHATE 10 MG/ML IJ SOLN
INTRAMUSCULAR | Status: AC
  Filled 2020-06-18: qty 1

## 2020-06-18 MED ORDER — BUPIVACAINE HCL (PF) 0.25 % IJ SOLN
INTRAMUSCULAR | Status: AC
  Filled 2020-06-18: qty 30

## 2020-06-18 MED ORDER — MIDAZOLAM HCL 2 MG/2ML IJ SOLN
INTRAMUSCULAR | Status: AC
  Filled 2020-06-18: qty 2

## 2020-06-18 MED ORDER — ONDANSETRON HCL 4 MG/2ML IJ SOLN
INTRAMUSCULAR | Status: AC
  Filled 2020-06-18: qty 2

## 2020-06-18 SURGICAL SUPPLY — 50 items
BAG BANDED W/RUBBER/TAPE 36X54 (MISCELLANEOUS) ×3 IMPLANT
BAG DECANTER FOR FLEXI CONT (MISCELLANEOUS) ×2 IMPLANT
BAG SNAP BAND KOVER 36X36 (MISCELLANEOUS) ×3 IMPLANT
BLADE SURG 10 STRL SS (BLADE) ×2 IMPLANT
CANISTER SUCT 3000ML PPV (MISCELLANEOUS) ×3 IMPLANT
CHLORAPREP W/TINT 26 (MISCELLANEOUS) ×3 IMPLANT
COVER BACK TABLE 60X90IN (DRAPES) ×3 IMPLANT
COVER DOME SNAP 22 D (MISCELLANEOUS) ×3 IMPLANT
COVER MAYO STAND STRL (DRAPES) ×2 IMPLANT
COVER PROBE W GEL 5X96 (DRAPES) ×1 IMPLANT
DECANTER SPIKE VIAL GLASS SM (MISCELLANEOUS) ×2 IMPLANT
DERMABOND ADVANCED (GAUZE/BANDAGES/DRESSINGS) ×1
DERMABOND ADVANCED .7 DNX12 (GAUZE/BANDAGES/DRESSINGS) ×1 IMPLANT
DRAPE CHEST BREAST 15X10 FENES (DRAPES) ×2 IMPLANT
DRAPE FEMORAL ANGIO 80X135IN (DRAPES) ×1 IMPLANT
ELECT CAUTERY BLADE 6.4 (BLADE) ×2 IMPLANT
ELECT REM PT RETURN 9FT ADLT (ELECTROSURGICAL)
ELECTRODE REM PT RTRN 9FT ADLT (ELECTROSURGICAL) IMPLANT
GAUZE 4X4 16PLY RFD (DISPOSABLE) ×2 IMPLANT
GLOVE BIO SURGEON STRL SZ7.5 (GLOVE) ×3 IMPLANT
GLOVE BIOGEL PI IND STRL 8 (GLOVE) ×1 IMPLANT
GLOVE BIOGEL PI INDICATOR 8 (GLOVE) ×1
GOWN STRL REUS W/ TWL LRG LVL3 (GOWN DISPOSABLE) ×4 IMPLANT
GOWN STRL REUS W/ TWL XL LVL3 (GOWN DISPOSABLE) ×1 IMPLANT
GOWN STRL REUS W/TWL 2XL LVL3 (GOWN DISPOSABLE) ×2 IMPLANT
GOWN STRL REUS W/TWL LRG LVL3 (GOWN DISPOSABLE) ×1
GOWN STRL REUS W/TWL XL LVL3 (GOWN DISPOSABLE) ×1
KIT BASIN OR (CUSTOM PROCEDURE TRAY) ×3 IMPLANT
KIT TURNOVER KIT B (KITS) ×3 IMPLANT
NS IRRIG 1000ML POUR BTL (IV SOLUTION) ×3 IMPLANT
PAD ARMBOARD 7.5X6 YLW CONV (MISCELLANEOUS) ×6 IMPLANT
PENCIL BUTTON HOLSTER BLD 10FT (ELECTRODE) ×2 IMPLANT
PROTECTION STATION PRESSURIZED (MISCELLANEOUS) ×3
SET MICROPUNCTURE 5F STIFF (MISCELLANEOUS) IMPLANT
SHEATH PINNACLE 5F 10CM (SHEATH) ×3 IMPLANT
SPONGE LAP 18X18 RF (DISPOSABLE) ×2 IMPLANT
STATION PROTECTION PRESSURIZED (MISCELLANEOUS) ×2 IMPLANT
STOPCOCK MORSE 400PSI 3WAY (MISCELLANEOUS) ×3 IMPLANT
SUT VIC AB 3-0 SH 27 (SUTURE) ×1
SUT VIC AB 3-0 SH 27X BRD (SUTURE) ×1 IMPLANT
SUT VIC AB 4-0 PS2 18 (SUTURE) ×2 IMPLANT
SYR 10ML LL (SYRINGE) ×10 IMPLANT
SYR 20ML LL LF (SYRINGE) ×4 IMPLANT
TOWEL GREEN STERILE (TOWEL DISPOSABLE) ×3 IMPLANT
TOWEL GREEN STERILE FF (TOWEL DISPOSABLE) ×2 IMPLANT
TRAY FOLEY MTR SLVR 16FR STAT (SET/KITS/TRAYS/PACK) IMPLANT
TUBE CONNECTING 12X1/4 (SUCTIONS) ×2 IMPLANT
TUBING CIL FLEX 10 FLL-RA (TUBING) ×1 IMPLANT
UNDERPAD 30X36 HEAVY ABSORB (UNDERPADS AND DIAPERS) IMPLANT
WATER STERILE IRR 1000ML POUR (IV SOLUTION) IMPLANT

## 2020-06-18 NOTE — Anesthesia Postprocedure Evaluation (Signed)
Anesthesia Post Note  Patient: Beth Brock  Procedure(s) Performed: CYSTOSCOPY WITHLEFT  RETROGRADE PYELOGRAM AND LEFT DOUBLE J  STENT PLACEMENT (Left )     Patient location during evaluation: PACU Anesthesia Type: General Level of consciousness: sedated and patient cooperative Pain management: pain level controlled Vital Signs Assessment: post-procedure vital signs reviewed and stable Respiratory status: spontaneous breathing Cardiovascular status: stable Anesthetic complications: no   No complications documented.  Last Vitals:  Vitals:   06/18/20 0451 06/18/20 0812  BP: 116/69 128/81  Pulse: 82 (!) 113  Resp: 18 18  Temp: 36.9 C 36.8 C  SpO2: 95% 93%    Last Pain:  Vitals:   06/18/20 0812  TempSrc: Oral  PainSc: 0-No pain                 Nolon Nations

## 2020-06-18 NOTE — Progress Notes (Signed)
   06/18/20 0812  Assess: MEWS Score  Temp 98.2 F (36.8 C)  BP 128/81  Pulse Rate (!) 113  Resp 18  SpO2 93 %  O2 Device Room Air  Assess: MEWS Score  MEWS Temp 0  MEWS Systolic 0  MEWS Pulse 2  MEWS RR 0  MEWS LOC 0  MEWS Score 2  MEWS Score Color Yellow  Assess: if the MEWS score is Yellow or Red  Were vital signs taken at a resting state? Yes  Focused Assessment No change from prior assessment  Treat  Pain Scale 0-10  Pain Score 0  Take Vital Signs  Increase Vital Sign Frequency  Yellow: Q 2hr X 2 then Q 4hr X 2, if remains yellow, continue Q 4hrs  Escalate  MEWS: Escalate Yellow: discuss with charge nurse/RN and consider discussing with provider and RRT  Notify: Charge Nurse/RN  Name of Charge Nurse/RN Notified Marlyn, RN  Date Charge Nurse/RN Notified 06/18/20  Time Charge Nurse/RN Notified [phone]  Document  Patient Outcome Other (Comment) (Patient to vascular surgery)  Patient up in room ambulating. Pt upset that surgery time was not told to her ahead of time. Emotional reassurance provided. Patient alert oriented, conversant and ambulatory, denies pain or SOB.

## 2020-06-18 NOTE — Progress Notes (Addendum)
Subjective: The patient is feeling better this AM. She reports that her abdominal pain is less frequent and severe. She reports that she has urinated multiple times since her procedure. She also reports that she has been able to laugh a bit more before she has left sided neck pain.   Objective:  Vital signs in last 24 hours: Vitals:   06/18/20 1230 06/18/20 1245 06/18/20 1300 06/18/20 1326  BP: 138/86 130/74 140/76 128/75  Pulse: 79 77 79 88  Resp: 14 17 16 16   Temp: 97.7 F (36.5 C)   98.1 F (36.7 C)  TempSrc:    Oral  SpO2: 100% 100% 100% 97%  Weight:      Height:       Physical Exam HENT:     Head: Atraumatic.     Comments: Left side of face is mildly swollen. Upper left lip is also swollen. But not painful Neck:     Comments: Mild tenderness to palpation on the left side of the neck Distended Jugular vein most noticeable on the left. Bilateral distended chest collateral veins Cardiovascular:     Rate and Rhythm: Normal rate and regular rhythm.  Pulmonary:     Effort: Pulmonary effort is normal.     Breath sounds: Normal breath sounds.  Abdominal:     General: Abdomen is flat. Bowel sounds are normal.     Palpations: Abdomen is soft.  Neurological:     Mental Status: She is alert.  Psychiatric:        Mood and Affect: Mood normal.        Behavior: Behavior normal.     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 There were no complications. Vascular will arrange to fllow-up with  patient in 2 weeks. Expect slow improvements in the SVC syndrome over the coming weeks. We will continue anticoagulation for the IJ thrombus. -Resume therapeutic dosing of Lovenox tonight.   Superior Mesenteric Vein thrombosis CT noted focal narrowing of the distal superior mesenteric vein with possible focal thrombosis versus malignancy associated. Abdominal pain may be related to this. She is at risk for a hypercoagulable state due to pancreatic cancer, and certainly is high risk if this thrombus developed while she was taking Xarelto. - Restart Lovenox tonight   Left hydronephrosis Double- J stent placed yesterday.  Patient reports decrease pain and has urinated multiple times since the procedure. Gross hematuria appears to have resolved.   Pancreatic cancer: Suspected pancreatic adenocarcinoma based on imaging findings and continued rise in the patient's CA 19-9, underwent multiple biopsy attempts however specific cytopathologic diagnosis has not been achieved.  Managed by [facility], not currently on chemotherapy.  Last saw oncology on 8/10, was not tolerating capecitabine well, holding off on further doses at this time until follow-up on October 5.     Prior to Admission Living Arrangement: Home Anticipated Discharge Location: Home Barriers to Discharge: Treatment Dispo: Anticipated discharge tomorrow  Freida Busman, MD 06/18/2020, 1:50 PM Pager:[phone] After 5pm on weekdays and 1pm on weekends: On Call  pager [phone]

## 2020-06-18 NOTE — Anesthesia Procedure Notes (Signed)
Procedure Name: MAC Date/Time: 06/18/2020 11:00 AM Performed by: Eligha Bridegroom, CRNA Pre-anesthesia Checklist: Patient identified, Suction available, Emergency Drugs available, Patient being monitored and Timeout performed Patient Re-evaluated:Patient Re-evaluated prior to induction Oxygen Delivery Method: Nasal cannula Preoxygenation: Pre-oxygenation with 100% oxygen Induction Type: IV induction

## 2020-06-18 NOTE — Progress Notes (Signed)
ANTICOAGULATION CONSULT NOTE - Follow Up Consult  Pharmacy Consult for Xarelto to Lovenox Indication: PE history / SVC  Allergies  Allergen Reactions  . Eliquis [Apixaban] Swelling    Face swelling   . Iodinated Diagnostic Agents Itching    Patient Measurements: Height: 5\' 7"  (170.2 cm) Weight: 59.3 kg (130 lb 11.2 oz) IBW/kg (Calculated) : 61.6  Vital Signs: Temp: 98.1 F (36.7 C) (10/01 1326) Temp Source: Oral (10/01 1326) BP: 128/75 (10/01 1326) Pulse Rate: 88 (10/01 1326)  Labs: Recent Labs    06/16/20 0920 06/16/20 0920 06/17/20 1211 06/18/20 0254  HGB 11.6*   < > 11.2* 11.4*  HCT 36.9  --  35.6* 36.7  PLT 196  --  187 191  LABPROT 13.7  --   --   --   INR 1.1  --   --   --   CREATININE 0.94  --  0.80 0.74   < > = values in this interval not displayed.    Estimated Creatinine Clearance: 60.4 mL/min (by C-G formula based on SCr of 0.74 mg/dL).   Assessment: 69 year old female with history of pancreatic cancer and history of PE.  Developed possible allergic reaction to Eliquis and switched to Xarelto.  Switched on 06/15/20 to Lovenox to ensure she is not having ongoing thrombosis with NOAC treatment.  Developed superior vena cava narrowing.  Lovenox held by [facility] on 9/30 for surgery to remove port-a-cath.  Dr. Oneida Alar noted patient will need  plan lifelong anticoagulation,  likely lovenox. Now s/p port-a-cath removal 10/1 and Dr. Oneida Alar ordered to resume Lovenox  after 16:00 10/1.  Lovenox 1.5mg /kg q24h =90 mg sq every 24 hours No bleeding reported,  Hgb 11s stable, pltc wnl stable.  SCr 0.74 stable, CrCl ~60 ml/ min   Goal of Therapy:  Anti-Xa level 0.6-1 units/ml 4hrs after LMWH dose given Monitor platelets by anticoagulation protocol: Yes   Plan:  Lovenox 90 mg sq Q 24 hours (1.5 mg / kg sq Q 24)- restart after 16:00 today.,  Scheduled next dose at 17:30 today. Follow CBC q72 hours., Monitor renal function and for s/sx of bleeding.  Thank you Nicole Cella, RPh Clinical Pharmacist  [phone] Please check AMION for all [place] phone numbers After 10:00 PM, call [place] [phone]  06/18/2020,2:20 PM

## 2020-06-18 NOTE — Anesthesia Postprocedure Evaluation (Signed)
Anesthesia Post Note  Patient: Beth Brock  Procedure(s) Performed: REMOVAL PORT-A-CATH (Right Chest)     Patient location during evaluation: PACU Anesthesia Type: MAC Level of consciousness: awake and alert Pain management: pain level controlled Vital Signs Assessment: post-procedure vital signs reviewed and stable Respiratory status: spontaneous breathing, nonlabored ventilation, respiratory function stable and patient connected to nasal cannula oxygen Cardiovascular status: stable and blood pressure returned to baseline Postop Assessment: no apparent nausea or vomiting Anesthetic complications: no   No complications documented.  Last Vitals:  Vitals:   06/18/20 1300 06/18/20 1326  BP: 140/76 128/75  Pulse: 79 88  Resp: 16 16  Temp:  36.7 C  SpO2: 100% 97%    Last Pain:  Vitals:   06/18/20 1326  TempSrc: Oral  PainSc: 0-No pain                 Phinley Schall S

## 2020-06-18 NOTE — Op Note (Addendum)
Procedure: Removal of Port-A-Cath  Preoperative diagnosis: Retained Port-A-Cath  Postoperative diagnosis: Same  Anesthesia: Local with IV sedation  Operative findings: Right internal jugular vein Port-A-Cath removed intact  Indications: Patient has a right internal jugular vein Port-A-Cath which is currently in place and had been used for chemotherapy for pancreatic cancer. We have confirmed with her oncologist at [facility] is not going to be used further. She has developed superior vena cava narrowing.  Operative details: After team informed consent, the patient taken the operating. The patient placed supine position operating table. After adequate sedation patient's entire right neck and chest were prepped and draped in usual sterile fashion. Local anesthesia was infiltrated as a mixture of 1% lidocaine quarter percent Marcaine over the area of the Port-A-Cath box. Transverse incision was made in this location carried on through the subcutaneous tissues down to the level of the Port-A-Cath. Box was freed up completely. Gentle traction was then used to remove the entire catheter intact. Direct pressure was held at the base of the internal jugular vein for approximately 5 minutes. Hemostasis was obtained. Subcutaneous tissues were reapproximated using running 3-0 Vicryl suture. Skin was closed with a 4-0 Vicryl subcuticular stitch. Dermabond was applied. Patient tired procedure well and there were no complications. Instrument sponge needle counts correct in the case. Patient taken the recovery in stable condition.  We will arrange a follow-up visit for her to see [place] in 2 weeks time.  Ruta Hinds, MD [facility] Office: [phone]

## 2020-06-18 NOTE — Plan of Care (Signed)
  Problem: Education: Goal: Knowledge of General Education information will improve Description: Including pain rating scale, medication(s)/side effects and non-pharmacologic comfort measures Outcome: Progressing   Problem: Activity: Goal: Risk for activity intolerance will decrease Outcome: Progressing  Patient up in room, performing hygiene and adl's. Denies pain, denies SOB.

## 2020-06-18 NOTE — Interval H&P Note (Signed)
History and Physical Interval Note:  06/18/2020 10:24 AM  Beth Brock  has presented today for surgery, with the diagnosis of SUPERIOR VENA CAVA SYNDROME.  The various methods of treatment have been discussed with the patient and family. After consideration of risks, benefits and other options for treatment, the patient has consented to  Procedure(s): REMOVAL PORT-A-CATH (N/A) POSSIBLE CENTRAL VENOGRAM (N/A) as a surgical intervention.  The patient's history has been reviewed, patient examined, no change in status, stable for surgery.  I have reviewed the patient's chart and labs.  Questions were answered to the patient's satisfaction.     Ruta Hinds

## 2020-06-18 NOTE — Transfer of Care (Signed)
Immediate Anesthesia Transfer of Care Note  Patient: [name]  Procedure(s) Performed: REMOVAL PORT-A-CATH (Right Chest)  Patient Location: PACU  Anesthesia Type:MAC  Level of Consciousness: awake and alert   Airway & Oxygen Therapy: Patient Spontanous Breathing  Post-op Assessment: Report given to RN and Post -op Vital signs reviewed and stable  Post vital signs: Reviewed and stable  Last Vitals:  Vitals Value Taken Time  BP 115/71 06/18/20 1142  Temp    Pulse 84 06/18/20 1142  Resp 13 06/18/20 1142  SpO2 99 % 06/18/20 1142  Vitals shown include unvalidated device data.  Last Pain:  Vitals:   06/18/20 0812  TempSrc: Oral  PainSc: 0-No pain      Patients Stated Pain Goal: 3 (63/01/60 1093)  Complications: No complications documented.

## 2020-06-18 NOTE — Plan of Care (Signed)
  Problem: Education: Goal: Knowledge of General Education information will improve Description: Including pain rating scale, medication(s)/side effects and non-pharmacologic comfort measures Outcome: Progressing   Problem: Clinical Measurements: Goal: Will remain free from infection Outcome: Progressing Goal: Diagnostic test results will improve Outcome: Progressing   Problem: Clinical Measurements: Goal: Diagnostic test results will improve Outcome: Progressing

## 2020-06-19 ENCOUNTER — Encounter (HOSPITAL_COMMUNITY): Payer: Self-pay | Admitting: Vascular Surgery

## 2020-06-19 LAB — CBC WITH DIFFERENTIAL/PLATELET
Abs Immature Granulocytes: 0.02 10*3/uL (ref 0.00–0.07)
Basophils Absolute: 0 10*3/uL (ref 0.0–0.1)
Basophils Relative: 1 %
Eosinophils Absolute: 0.1 10*3/uL (ref 0.0–0.5)
Eosinophils Relative: 1 %
HCT: 34.6 % — ABNORMAL LOW (ref 36.0–46.0)
Hemoglobin: 11.1 g/dL — ABNORMAL LOW (ref 12.0–15.0)
Immature Granulocytes: 0 %
Lymphocytes Relative: 30 %
Lymphs Abs: 2 10*3/uL (ref 0.7–4.0)
MCH: 29 pg (ref 26.0–34.0)
MCHC: 32.1 g/dL (ref 30.0–36.0)
MCV: 90.3 fL (ref 80.0–100.0)
Monocytes Absolute: 0.6 10*3/uL (ref 0.1–1.0)
Monocytes Relative: 9 %
Neutro Abs: 4 10*3/uL (ref 1.7–7.7)
Neutrophils Relative %: 59 %
Platelets: 196 10*3/uL (ref 150–400)
RBC: 3.83 MIL/uL — ABNORMAL LOW (ref 3.87–5.11)
RDW: 13.3 % (ref 11.5–15.5)
WBC: 6.7 10*3/uL (ref 4.0–10.5)
nRBC: 0 % (ref 0.0–0.2)

## 2020-06-19 LAB — RENAL FUNCTION PANEL
Albumin: 3.1 g/dL — ABNORMAL LOW (ref 3.5–5.0)
Anion gap: 9 (ref 5–15)
BUN: 5 mg/dL — ABNORMAL LOW (ref 8–23)
CO2: 26 mmol/L (ref 22–32)
Calcium: 10.2 mg/dL (ref 8.9–10.3)
Chloride: 104 mmol/L (ref 98–111)
Creatinine, Ser: 0.66 mg/dL (ref 0.44–1.00)
GFR calc Af Amer: >60 mL/min (ref >60)
GFR calc non Af Amer: >60 mL/min (ref >60)
Glucose, Bld: 95 mg/dL (ref 70–99)
Phosphorus: 3 mg/dL (ref 2.5–4.6)
Potassium: 4 mmol/L (ref 3.5–5.1)
Sodium: 139 mmol/L (ref 135–145)

## 2020-06-19 MED ORDER — ENOXAPARIN SODIUM 100 MG/ML ~~LOC~~ SOLN: 90.0000 mg | SUBCUTANEOUS | 0 refills | Status: DC

## 2020-06-19 NOTE — Discharge Instructions (Signed)
Dear Rachel Bo,   Thank you so much for allowing [place] to be part of your care!  You were admitted to [facility] for Superior vena cava syndrome. You also had a stent placed to help relieve fluid pressure on your Left kidney (Left Kidney hydronephrosis).   POST-HOSPITAL & CARE INSTRUCTIONS 1.  Please follow up with Vascular Surgery in 2 weeks: [facility] Call Office: [phone], Your surgeon for [place] removal was Dr. Ruta Hinds 2. If you did not get lovenox injection today please inject 1 once you receive your prescriptions. 2. Please let PCP/Specialists (oncologist and hematologist) know of any changes that were made.  3. Please see medications section of this packet for any medication changes.   DOCTOR'S APPOINTMENT & FOLLOW UP CARE INSTRUCTIONS  Schedule appt. with Vascular Surgery Please follow-up with your oncologist and Primary care provider.   RETURN PRECAUTIONS:   Take care and be well!  [facility]  [address] [place], [place] [address] [phone]

## 2020-06-19 NOTE — Progress Notes (Signed)
Nsg Discharge Note  Lovenox teaching done with patient and her daughter.   Admit Date:  06/14/2020 Discharge date: 06/19/2020   Beth Brock to be D/C'd Home per MD order.  AVS completed.  Copy for chart, and copy for patient signed, and dated. Patient/caregiver able to verbalize understanding.  Discharge Medication: Allergies as of 06/19/2020      Reactions   Eliquis [apixaban] Swelling   Face swelling    Iodinated Diagnostic Agents Itching      Medication List    STOP taking these medications   alendronate 70 MG tablet Commonly known as: FOSAMAX   methylPREDNISolone 4 MG tablet Commonly known as: MEDROL   Xarelto 20 MG Tabs tablet Generic drug: rivaroxaban     TAKE these medications   albuterol 108 (90 Base) MCG/ACT inhaler Commonly known as: VENTOLIN HFA Inhale 1-2 puffs into the lungs as needed for wheezing or shortness of breath.   CALCIUM PO Take 1 tablet by mouth daily.   Creon 24000-76000 units Cpep Generic drug: Pancrelipase (Lip-Prot-Amyl) Take 2 capsules by mouth 3 (three) times daily with meals.   enoxaparin 100 MG/ML injection Commonly known as: LOVENOX Inject 0.9 mLs (90 mg total) into the skin daily for 30 doses.   Lubricant Eye Drops 0.4-0.3 % Soln Generic drug: Polyethyl Glycol-Propyl Glycol Place 2 drops into both eyes as needed (for dry/irritated eyes.).   NONI JUICE PO Take 30 mLs by mouth daily.   omeprazole 40 MG capsule Commonly known as: PRILOSEC Take 40 mg by mouth as needed (acid reflux).   prochlorperazine 10 MG tablet Commonly known as: COMPAZINE Take 10 mg by mouth every 6 (six) hours.   VITAMIN C PO Take 2 tablets by mouth daily.   VITAMIN D3 PO Take 1 tablet by mouth daily.       Discharge Assessment: Vitals:   06/19/20 0007 06/19/20 0523  BP: 118/76 113/72  Pulse: 80 78  Resp: 18 18  Temp: 98.5 F (36.9 C) 98.4 F (36.9 C)  SpO2: 95% 94%   Skin clean, dry and intact without evidence of skin break down,  no evidence of skin tears noted. IV catheter discontinued intact. Site without signs and symptoms of complications - no redness or edema noted at insertion site, patient denies c/o pain - only slight tenderness at site.  Dressing with slight pressure applied.  D/c Instructions-Education: Discharge instructions given to patient/family with verbalized understanding. D/c education completed with patient/family including follow up instructions, medication list, d/c activities limitations if indicated, with other d/c instructions as indicated by MD - patient able to verbalize understanding, all questions fully answered. Patient instructed to return to ED, call 911, or call MD for any changes in condition.  Patient escorted via [place], and D/C home via private auto.  Erasmo Leventhal, RN 06/19/2020 12:08 PM

## 2020-06-19 NOTE — Progress Notes (Signed)
° °  Subjective: Patient reported doing extremely well this AM. She wanted to get dressed and show [place] that she is ready to go and was out of bed. She reports no neck pain this AM. She does report that she was a bit concerned about her frank hematuria.  Objective:  Vital signs in last 24 hours: Vitals:   06/18/20 1300 06/18/20 1326 06/19/20 0007 06/19/20 0523  BP: 140/76 128/75 118/76 113/72  Pulse: 79 88 80 78  Resp: 16 16 18 18   Temp:  98.1 F (36.7 C) 98.5 F (36.9 C) 98.4 F (36.9 C)  TempSrc:  Oral Oral Oral  SpO2: 100% 97% 95% 94%  Weight:    58.7 kg  Height:       Physical Exam Constitutional:      Appearance: Normal appearance.  HENT:     Head: Normocephalic and atraumatic.  Eyes:     Extraocular Movements: Extraocular movements intact.  Cardiovascular:     Rate and Rhythm: Regular rhythm. Tachycardia present.  Pulmonary:     Effort: Pulmonary effort is normal.     Breath sounds: Normal breath sounds.  Abdominal:     General: Bowel sounds are normal. There is no distension.     Palpations: Abdomen is soft.  Neurological:     Mental Status: She is alert and oriented to person, place, and time.  Psychiatric:        Mood and Affect: Mood normal.        Behavior: Behavior normal.     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 Site appeared to be healing well.  Vascular will see the patient in 2 weeks for followup. The patient has noticed a large improvement and had no pain  with swallowing or laughing this AM. She also had no pain to palpation.  Superior Mesenteric Vein thrombosis Patient is not reporting abdominal pain. She understand that she will have to take Lovenox and will follow-up with her Oncologist on Tuesday of next week. Pharmacy was consulted and based on weight patient will take 90mg  Lovenox daily.   Left hydronephrosis Patient hematuria reappeared, but is likely to continue for approx 1 week. Patient was instructed to schedule an appointment with her PCP if it continues or she begins to have pain. Patient preferred this idea and understood that her hematuria will likely persist a few mor days.   Pancreatic cancer: Suspected pancreatic adenocarcinoma based on imaging findings and continued rise in the patient's CA 19-9, underwent multiple biopsy attempts however specific cytopathologic diagnosis has not been achieved.[facility]. Last saw oncology on 8/10, was not tolerating capecitabine well,holding off on further doses at this time until follow-upon October 5. - Follow-up Tues. 06/22/2020    Prior to Admission Living Arrangement: Home Anticipated Discharge Location: Home Barriers to Discharge: None Dispo: Anticipated discharge today  Freida Busman, MD 06/19/2020, 12:22 PM Pager: [phone] After 5pm on weekdays and 1pm on weekends: On Call pager [phone]

## 2020-06-19 NOTE — Care Management (Signed)
Spoke w pharmacy, they have Lovenox in stock and cost is less than $1.

## 2020-06-19 NOTE — Discharge Summary (Addendum)
Name: Beth Brock MRN: [id number] DOB: 1950/10/20 69 y.o. PCP: Audley Hose, MD  Date of Admission: 06/14/2020  1:00 PM Date of Discharge: 06/19/2020 Attending Physician: Lalla Brothers, MD  Discharge Diagnosis: Principal Problem:   SVC (superior vena cava obstruction) Active Problems:   Malignant tumor of head of pancreas ([place])   Superior mesenteric vein thrombosis (HCC)   Hydronephrosis of left kidney   Discharge Medications: Allergies as of 06/19/2020       Reactions   Eliquis [apixaban] Swelling   Face swelling    Iodinated Diagnostic Agents Itching        Medication List     STOP taking these medications    alendronate 70 MG tablet Commonly known as: FOSAMAX   methylPREDNISolone 4 MG tablet Commonly known as: MEDROL   Xarelto 20 MG Tabs tablet Generic drug: rivaroxaban       TAKE these medications    albuterol 108 (90 Base) MCG/ACT inhaler Commonly known as: VENTOLIN HFA Inhale 1-2 puffs into the lungs as needed for wheezing or shortness of breath.   CALCIUM PO Take 1 tablet by mouth daily.   Creon 24000-76000 units Cpep Generic drug: Pancrelipase (Lip-Prot-Amyl) Take 2 capsules by mouth 3 (three) times daily with meals.   enoxaparin 100 MG/ML injection Commonly known as: LOVENOX Inject 0.9 mLs (90 mg total) into the skin daily for 30 doses.   Lubricant Eye Drops 0.4-0.3 % Soln Generic drug: Polyethyl Glycol-Propyl Glycol Place 2 drops into both eyes as needed (for dry/irritated eyes.).   NONI JUICE PO Take 30 mLs by mouth daily.   omeprazole 40 MG capsule Commonly known as: PRILOSEC Take 40 mg by mouth as needed (acid reflux).   prochlorperazine 10 MG tablet Commonly known as: COMPAZINE Take 10 mg by mouth every 6 (six) hours.   VITAMIN C PO Take 2 tablets by mouth daily.   VITAMIN D3 PO Take 1 tablet by mouth daily.        Disposition and follow-up:   Ms.Beth Brock was discharged from [facility] in Stable condition.  At the hospital follow up visit please address:  1.  Please address if patient's hematuria is improving. 2. Please assess if the patient is having pain with swallowing or breathing. 3. Please assess if the patient's Lovenox regimen 90mg  daily is causing any adverse effects.  2.  Labs / imaging needed at time of follow-up: At the discretion of specialist.  3.  Pending labs/ test needing follow-up:  Oncology please reassess patient's CT Chest Abdomen Pelvis from this visit   Follow-up Appointments:  Follow-up Information     [facility] Follow up.   Why: Please call for an appt. Appt should be no further out than 2 weeks from discharge. Contact information: [address] [place] by problem list: This is a 69 year old female with a history of pancreatic cancer, pulmonary embolism on Xarelto, COPD, diverticulitis, hyperlipidemia, and osteoporosis presenting with left neck pain and swelling of neck veins.   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 Patient reported she was beginning to have pain swallowing and when attempting to laugh. CT chest abdomen pelvis revealed focal narrowing of the mid distal SVC with multiple collateral veins  in the neck.  After discussion with her oncologist at [facility], we decided to remove the right chest Port-A-Cath in the hopes that it would improve her symptoms.  One day after the catheter was removed, she was already reporting good improvement in neck swelling and discomfort. She will follow up with Dr. Earnestine Mealing, if more chemotherapy is needed in the future a left sided port can be placed.   Superior Mesenteric Vein thrombosis Patient was noted to have abdominal pain at presentation. CT Chest Abdomen Pelvis  noted focal narrowing of the distal superior mesenteric vein with possible focal thrombosis despite being on a DOAC. We switched anticoagulation to Lovenox treatment dose. She will continue this for at least 4 weeks and follow up with her oncologist. Long term anticoagulation plan will depend on prognosis.   Left hydronephrosis Associated with hematuria. CT Chest Abdomen Pelvis noted that patient had left hydronephrosis 2/2 UPJ obstruction but no stones were identified. Etiology likely related to pancreatic cancer compressing the ureter. Urology was consulted who performed cystoscopy and placed double J stent in the left ureter. Patient had decreased abdominal pain after procedure and had improvement in hematuria.  Pancreatic cancer: Chemotherapy on pause right now due to side effects. CT imaging noted new opacities in the lungs that may represent metastatic disease. We discussed this with Dr. Earnestine Mealing, who will follow it up, may be a potential target for biopsy in the future to firm up the diagnosis.    Discharge Vitals:   BP 113/72 (BP Location: Left Arm)   Pulse 78   Temp 98.4 F (36.9 C) (Oral)   Resp 18   Ht 5\' 7"  (1.702 m)   Wt 58.7 kg   LMP  (LMP Unknown)   SpO2 94%   BMI 20.28 kg/m   Pertinent Labs, Studies, and Procedures:  06/17/2020- Cystoscopy w/ Double J stent placement 06/19/2020- Port- A- Cath removal  DG Chest Port 1 View  Result Date: 06/18/2020 CLINICAL DATA:  Status post Port-A-Cath removal. EXAM: PORTABLE CHEST 1 VIEW COMPARISON:  June 14, 2020. FINDINGS: The heart size and mediastinal contours are within normal limits. No pneumothorax or pleural effusion is noted. Right lung is clear. Stable irregular opacity is noted laterally in the left upper lobe which may represent scarring or inflammation. The visualized skeletal structures are unremarkable. IMPRESSION: Stable irregular opacity is noted laterally in the left upper lobe which may represent scarring or  inflammation. No pneumothorax is noted. Electronically Signed   By: Marijo Conception M.D.   On: 06/18/2020 13:12   DG C-Arm 1-60 Min  Result Date: 06/17/2020 CLINICAL DATA:  CYSTOSCOPY WITHLEFT RETROGRADE PYELOGRAM AND LEFT DOUBLE J STENT PLACEMENT RSTO- EXAM: DG C-ARM 1-60 MIN CONTRAST:  Administered via urinary bladder/foley catheter. FLUOROSCOPY TIME:  Fluoroscopy Time:  11.2 seconds Radiation Exposure Index (if provided by the fluoroscopic device): 0.51 mGy Number of Acquired Spot Images: 2 COMPARISON:  None. FINDINGS: Intraoperative left retrograde pyelogram. Two low resolution intraoperative spot views of the left abdomen and pelvis were obtained. Partial opacification of the left collecting system and urinary bladder is identified. The tip of a guidewire is noted overlying the left ureteropelvic junction on the first image. A tube is noted overlying the left pelvis on the second image. Total fluoroscopy time: 11.2 seconds Total radiation dose: 0.51 mGy IMPRESSION: Intraoperative left retrograde pyelogram. Electronically Signed   By: Iven Finn M.D.   On: 06/17/2020 19:07   HYBRID OR IMAGING ([facility] ONLY)  Result Date: 06/18/2020 There is  no interpretation for this exam.  This order is for images obtained during a surgical procedure.  Please See "Surgeries" Tab for more information regarding the procedure.    Discharge Instructions: Discharge Instructions     Diet general   Complete by: As directed    Increase activity slowly   Complete by: As directed    No wound care   Complete by: As directed        Signed: Freida Busman, MD 06/19/2020, 1:41 PM   Pager: [phone]

## 2020-07-05 ENCOUNTER — Telehealth: Payer: Self-pay

## 2020-07-05 NOTE — Telephone Encounter (Signed)
Patient called with report of pain on Friday night where her port was removed. Some swelling is present, but is not warm, red, or spreading. Patient denies current pain or fever. Told her to monitor site and keep it clean and dry and to call us with further concerns. She has a f/u scheduled.

## 2020-07-15 ENCOUNTER — Encounter (HOSPITAL_COMMUNITY): Payer: Self-pay | Admitting: Vascular Surgery

## 2020-07-15 ENCOUNTER — Other Ambulatory Visit: Payer: Self-pay

## 2020-07-15 ENCOUNTER — Ambulatory Visit (INDEPENDENT_AMBULATORY_CARE_PROVIDER_SITE_OTHER): Payer: Self-pay | Admitting: Vascular Surgery

## 2020-07-15 ENCOUNTER — Encounter: Payer: Self-pay | Admitting: Vascular Surgery

## 2020-07-15 VITALS — BP 130/86 | HR 100 | Temp 98.1°F | Resp 20 | Ht 67.0 in | Wt 128.0 lb

## 2020-07-15 DIAGNOSIS — I871 Compression of vein: Secondary | ICD-10-CM

## 2020-07-15 NOTE — Progress Notes (Signed)
Patient is a 65 she has known recurrent pancreatic cancer.  She had removal of her Port-A-Cath June 18, 2020.  She is currently on Lovenox therapeutic dose once daily in the evenings.  She states that she still gets some swelling of her eyes if she sleeps with her head of bed down.  However she thinks that her vein collaterals on the chest have improved as well as her overall swelling.  She did develop some increased swelling at her Port-A-Cath removal site and she states that this area is progressively enlarging.  She has no fever chills or drainage.  Physical exam:  Vitals:   07/15/20 1517  BP: 130/86  Pulse: 100  Resp: 20  Temp: 98.1 F (36.7 C)  SpO2: 95%  Weight: 128 lb (58.1 kg)  Height: 5\' 7"  (1.702 m)    Collateralization of veins across the anterior chest wall, right Port-A-Cath incision intact slightly fluctuant mass 3 x 3 cm  Assessment: Hematoma right Port-A-Cath removal site, improved superior vena cava obstruction with collateralization  Plan: I&D right Port-A-Cath site tomorrow in the operating room she will hold her evening Lovenox dose.  Risk benefits possible complications include not limited to infection recurrent bleeding recurrent accumulation of hematoma discussed with patient.  Ruta Hinds, MD [facility] [place] Office: [phone]

## 2020-07-15 NOTE — H&P (View-Only) (Signed)
Patient is a 59 she has known recurrent pancreatic cancer.  She had removal of her Port-A-Cath June 18, 2020.  She is currently on Lovenox therapeutic dose once daily in the evenings.  She states that she still gets some swelling of her eyes if she sleeps with her head of bed down.  However she thinks that her vein collaterals on the chest have improved as well as her overall swelling.  She did develop some increased swelling at her Port-A-Cath removal site and she states that this area is progressively enlarging.  She has no fever chills or drainage.  Physical exam:  Vitals:   07/15/20 1517  BP: 130/86  Pulse: 100  Resp: 20  Temp: 98.1 F (36.7 C)  SpO2: 95%  Weight: 128 lb (58.1 kg)  Height: 5\' 7"  (1.702 m)    Collateralization of veins across the anterior chest wall, right Port-A-Cath incision intact slightly fluctuant mass 3 x 3 cm  Assessment: Hematoma right Port-A-Cath removal site, improved superior vena cava obstruction with collateralization  Plan: I&D right Port-A-Cath site tomorrow in the operating room she will hold her evening Lovenox dose.  Risk benefits possible complications include not limited to infection recurrent bleeding recurrent accumulation of hematoma discussed with patient.  Ruta Hinds, MD [facility] [place] Office: [phone]

## 2020-07-15 NOTE — Progress Notes (Signed)
PCP - Dr. Maia Petties  Cardiologist - pt denies   Chest x-ray - 06/14/20 EKG - 06/15/20 Stress Test - had it done in [place] and does not remember where  ECHO - pt denies Cardiac Cath - pt denies   Blood Thinner Instructions: hold Lovenox night before surgery Aspirin Instructions: n/a   COVID TEST- DOS   Anesthesia review: n/a   -------------  SDW INSTRUCTIONS:  Your procedure is scheduled on 07/16/20 Friday.  Report to [facility] Main Entrance "A" at 1000 A.M., and check in at the Admitting office.  Call this number if you have problems the morning of surgery: [phone]   Remember: Do not eat or drink after midnight the night before your surgery  Take these medicines the morning of surgery with A SIP OF WATER:  albuterol (VENTOLIN HFA) inhaler if needed ----Please bring all inhalers with you the day of surgery.  omeprazole (PRILOSEC)   Per surgeon orders - hold Lovenox evening dose night before surgery   As of today, STOP taking any Aspirin (unless otherwise instructed by your surgeon), Aleve, Naproxen, Ibuprofen, Motrin, Advil, Goody's, BC's, all herbal medications, fish oil, and all vitamins.    The Morning of Surgery  Do not wear jewelry, make-up or nail polish.  Do not wear lotions, powders, or perfumes, or deodorant  Do not shave 48 hours prior to surgery.    Do not bring valuables to the hospital.  [facility] is not responsible for any belongings or valuables.  If you are a smoker, DO NOT Smoke 24 hours prior to surgery  If you wear a CPAP at night please bring your mask the morning of surgery   Remember that you must have someone to transport you home after your surgery, and remain with you for 24 hours if you are discharged the same day.   Please bring cases for contacts, glasses, hearing aids, dentures or bridgework because it cannot be worn into surgery.    Leave your suitcase in the car.  After surgery it may be brought to your room.  For  patients admitted to the hospital, discharge time will be determined by your treatment team.  Patients discharged the day of surgery will not be allowed to drive home.    Special instructions:   - Preparing For Surgery  Oral Hygiene is also important to reduce your risk of infection.  Remember - BRUSH YOUR TEETH THE MORNING OF SURGERY WITH YOUR REGULAR TOOTHPASTE  Please follow these instructions carefully.   1. Shower the NIGHT BEFORE SURGERY and the MORNING OF SURGERY with DIAL Soap.   2. Wash thoroughly, paying special attention to the area where your surgery will be performed.  3. Thoroughly rinse your body with warm water from the neck down.  4. Pat yourself dry with a CLEAN TOWEL.  5. Wear CLEAN PAJAMAS to bed the night before surgery  6. Place CLEAN SHEETS on your bed the night of your first shower and DO NOT SLEEP WITH PETS.  7. Wear comfortable clothes the morning of surgery.    Day of Surgery:  Please shower the morning of surgery with the DIAL soap Do not apply any deodorants/lotions. Please wear clean clothes to the hospital/surgery center.   Remember to brush your teeth WITH YOUR REGULAR TOOTHPASTE.   Please read over the following fact sheets that you were given.  Patient denies shortness of breath, fever, cough and chest pain.

## 2020-07-16 ENCOUNTER — Telehealth: Payer: Self-pay | Admitting: *Deleted

## 2020-07-16 ENCOUNTER — Ambulatory Visit (HOSPITAL_COMMUNITY): Payer: Medicare HMO | Admitting: Anesthesiology

## 2020-07-16 ENCOUNTER — Encounter (HOSPITAL_COMMUNITY): Payer: Self-pay | Admitting: Vascular Surgery

## 2020-07-16 ENCOUNTER — Ambulatory Visit (HOSPITAL_COMMUNITY)
Admission: RE | Admit: 2020-07-16 | Discharge: 2020-07-16 | Disposition: A | Discharge status: Home / Self Care | Payer: Medicare HMO | Attending: Vascular Surgery | Admitting: Vascular Surgery

## 2020-07-16 ENCOUNTER — Encounter (HOSPITAL_COMMUNITY)
Admission: RE | Disposition: A | Discharge status: Home / Self Care | Payer: Self-pay | Source: Home / Self Care | Attending: Vascular Surgery

## 2020-07-16 DIAGNOSIS — I871 Compression of vein: Secondary | ICD-10-CM | POA: Insufficient documentation

## 2020-07-16 DIAGNOSIS — Y9389 Activity, other specified: Secondary | ICD-10-CM | POA: Insufficient documentation

## 2020-07-16 DIAGNOSIS — J449 Chronic obstructive pulmonary disease, unspecified: Secondary | ICD-10-CM | POA: Insufficient documentation

## 2020-07-16 DIAGNOSIS — Z87891 Personal history of nicotine dependence: Secondary | ICD-10-CM | POA: Insufficient documentation

## 2020-07-16 DIAGNOSIS — S20211A Contusion of right front wall of thorax, initial encounter: Secondary | ICD-10-CM | POA: Diagnosis present

## 2020-07-16 DIAGNOSIS — C259 Malignant neoplasm of pancreas, unspecified: Secondary | ICD-10-CM | POA: Insufficient documentation

## 2020-07-16 DIAGNOSIS — X58XXXA Exposure to other specified factors, initial encounter: Secondary | ICD-10-CM | POA: Insufficient documentation

## 2020-07-16 DIAGNOSIS — Z20822 Contact with and (suspected) exposure to covid-19: Secondary | ICD-10-CM | POA: Diagnosis not present

## 2020-07-16 HISTORY — DX: Chronic obstructive pulmonary disease, unspecified: J44.9

## 2020-07-16 HISTORY — PX: INCISION AND DRAINAGE: SHX5863

## 2020-07-16 LAB — CBC
HCT: 39.2 % (ref 36.0–46.0)
Hemoglobin: 12 g/dL (ref 12.0–15.0)
MCH: 27.6 pg (ref 26.0–34.0)
MCHC: 30.6 g/dL (ref 30.0–36.0)
MCV: 90.3 fL (ref 80.0–100.0)
Platelets: 199 10*3/uL (ref 150–400)
RBC: 4.34 MIL/uL (ref 3.87–5.11)
RDW: 13.2 % (ref 11.5–15.5)
WBC: 6.1 10*3/uL (ref 4.0–10.5)
nRBC: 0 % (ref 0.0–0.2)

## 2020-07-16 LAB — SARS CORONAVIRUS 2 BY RT PCR (HOSPITAL ORDER, PERFORMED IN ~~LOC~~ HOSPITAL LAB): SARS Coronavirus 2: NEGATIVE

## 2020-07-16 SURGERY — INCISION AND DRAINAGE
Anesthesia: General | Site: Chest | Laterality: Right

## 2020-07-16 MED ORDER — OXYCODONE-ACETAMINOPHEN 10-325 MG PO TABS
1.0000 | ORAL_TABLET | Freq: Four times a day (QID) | ORAL | 0 refills | Status: AC | PRN
Start: 2020-07-16 — End: 2021-07-16

## 2020-07-16 MED ORDER — BUPIVACAINE HCL (PF) 0.25 % IJ SOLN
INTRAMUSCULAR | Status: DC | PRN
  Administered 2020-07-16: 8.5 mL

## 2020-07-16 MED ORDER — LACTATED RINGERS IV SOLN: INTRAVENOUS | Status: DC

## 2020-07-16 MED ORDER — FENTANYL CITRATE (PF) 250 MCG/5ML IJ SOLN
INTRAMUSCULAR | Status: DC | PRN
  Administered 2020-07-16: 50 ug via INTRAVENOUS

## 2020-07-16 MED ORDER — LIDOCAINE-EPINEPHRINE (PF) 1 %-1:200000 IJ SOLN
INTRAMUSCULAR | Status: AC
  Filled 2020-07-16: qty 30

## 2020-07-16 MED ORDER — ONDANSETRON HCL 4 MG/2ML IJ SOLN
INTRAMUSCULAR | Status: DC | PRN
  Administered 2020-07-16: 4 mg via INTRAVENOUS

## 2020-07-16 MED ORDER — DEXAMETHASONE SODIUM PHOSPHATE 10 MG/ML IJ SOLN
INTRAMUSCULAR | Status: AC
  Filled 2020-07-16: qty 1

## 2020-07-16 MED ORDER — PROPOFOL 10 MG/ML IV BOLUS
INTRAVENOUS | Status: AC
  Filled 2020-07-16: qty 20

## 2020-07-16 MED ORDER — FENTANYL CITRATE (PF) 250 MCG/5ML IJ SOLN
INTRAMUSCULAR | Status: AC
  Filled 2020-07-16: qty 5

## 2020-07-16 MED ORDER — CHLORHEXIDINE GLUCONATE 0.12 % MT SOLN: 15.0000 mL | Freq: Once | OROMUCOSAL | Status: AC

## 2020-07-16 MED ORDER — CHLORHEXIDINE GLUCONATE 4 % EX LIQD: 60.0000 mL | Freq: Once | CUTANEOUS | Status: DC

## 2020-07-16 MED ORDER — GENTAMICIN SULFATE 40 MG/ML IJ SOLN
INTRAMUSCULAR | Status: AC
  Filled 2020-07-16: qty 6

## 2020-07-16 MED ORDER — OXYCODONE HCL 5 MG PO TABS: 5.0000 mg | ORAL_TABLET | Freq: Once | ORAL | Status: DC | PRN

## 2020-07-16 MED ORDER — DEXAMETHASONE SODIUM PHOSPHATE 10 MG/ML IJ SOLN
INTRAMUSCULAR | Status: DC | PRN
  Administered 2020-07-16: 5 mg via INTRAVENOUS

## 2020-07-16 MED ORDER — OXYCODONE HCL 5 MG/5ML PO SOLN: 5.0000 mg | Freq: Once | ORAL | Status: DC | PRN

## 2020-07-16 MED ORDER — ORAL CARE MOUTH RINSE: 15.0000 mL | Freq: Once | OROMUCOSAL | Status: AC

## 2020-07-16 MED ORDER — LIDOCAINE-EPINEPHRINE (PF) 1 %-1:200000 IJ SOLN
INTRAMUSCULAR | Status: DC | PRN
  Administered 2020-07-16: 8.5 mL

## 2020-07-16 MED ORDER — BUPIVACAINE HCL (PF) 0.25 % IJ SOLN
INTRAMUSCULAR | Status: AC
  Filled 2020-07-16: qty 20

## 2020-07-16 MED ORDER — 0.9 % SODIUM CHLORIDE (POUR BTL) OPTIME
TOPICAL | Status: DC | PRN
  Administered 2020-07-16: 1000 mL

## 2020-07-16 MED ORDER — LIDOCAINE 2% (20 MG/ML) 5 ML SYRINGE
INTRAMUSCULAR | Status: DC | PRN
  Administered 2020-07-16: 100 mg via INTRAVENOUS

## 2020-07-16 MED ORDER — SODIUM CHLORIDE 0.9 % IV SOLN: INTRAVENOUS | Status: DC

## 2020-07-16 MED ORDER — ONDANSETRON HCL 4 MG/2ML IJ SOLN
INTRAMUSCULAR | Status: AC
  Filled 2020-07-16: qty 2

## 2020-07-16 MED ORDER — CHLORHEXIDINE GLUCONATE 0.12 % MT SOLN
OROMUCOSAL | Status: AC
  Administered 2020-07-16: 15 mL via OROMUCOSAL
  Filled 2020-07-16: qty 15

## 2020-07-16 MED ORDER — LIDOCAINE 2% (20 MG/ML) 5 ML SYRINGE
INTRAMUSCULAR | Status: AC
  Filled 2020-07-16: qty 5

## 2020-07-16 MED ORDER — CEFAZOLIN SODIUM-DEXTROSE 2-4 GM/100ML-% IV SOLN
2.0000 g | INTRAVENOUS | Status: AC
  Administered 2020-07-16: 2 g via INTRAVENOUS

## 2020-07-16 MED ORDER — ENOXAPARIN SODIUM 40 MG/0.4ML ~~LOC~~ SOLN: 90.0000 mg | SUBCUTANEOUS | 0 refills | Status: DC

## 2020-07-16 MED ORDER — PROPOFOL 10 MG/ML IV BOLUS
INTRAVENOUS | Status: DC | PRN
  Administered 2020-07-16: 150 mg via INTRAVENOUS

## 2020-07-16 MED ORDER — FENTANYL CITRATE (PF) 100 MCG/2ML IJ SOLN: 25.0000 ug | INTRAMUSCULAR | Status: DC | PRN

## 2020-07-16 MED ORDER — VANCOMYCIN HCL 500 MG IV SOLR
INTRAVENOUS | Status: AC
  Filled 2020-07-16: qty 500

## 2020-07-16 MED ORDER — CEFAZOLIN SODIUM-DEXTROSE 2-4 GM/100ML-% IV SOLN
INTRAVENOUS | Status: AC
  Filled 2020-07-16: qty 100

## 2020-07-16 SURGICAL SUPPLY — 42 items
BNDG ELASTIC 4X5.8 VLCR STR LF (GAUZE/BANDAGES/DRESSINGS) IMPLANT
BNDG ELASTIC 6X5.8 VLCR STR LF (GAUZE/BANDAGES/DRESSINGS) IMPLANT
BNDG GAUZE ELAST 4 BULKY (GAUZE/BANDAGES/DRESSINGS) IMPLANT
CANISTER SUCT 3000ML PPV (MISCELLANEOUS) ×2 IMPLANT
CLIP VESOCCLUDE MED 6/CT (CLIP) IMPLANT
CLIP VESOCCLUDE SM WIDE 6/CT (CLIP) IMPLANT
COVER SURGICAL LIGHT HANDLE (MISCELLANEOUS) ×2 IMPLANT
COVER WAND RF STERILE (DRAPES) IMPLANT
DRAPE HALF SHEET 40X57 (DRAPES) IMPLANT
DRAPE U-SHAPE 76X120 STRL (DRAPES) IMPLANT
DRSG TEGADERM 4X4.75 (GAUZE/BANDAGES/DRESSINGS) ×4 IMPLANT
DRSG TELFA 3X8 NADH (GAUZE/BANDAGES/DRESSINGS) ×2 IMPLANT
ELECT REM PT RETURN 9FT ADLT (ELECTROSURGICAL) ×2
ELECTRODE REM PT RTRN 9FT ADLT (ELECTROSURGICAL) ×1 IMPLANT
GAUZE SPONGE 2X2 8PLY STRL LF (GAUZE/BANDAGES/DRESSINGS) ×1 IMPLANT
GAUZE SPONGE 4X4 12PLY STRL (GAUZE/BANDAGES/DRESSINGS) ×2 IMPLANT
GAUZE XEROFORM 5X9 LF (GAUZE/BANDAGES/DRESSINGS) IMPLANT
GLOVE BIO SURGEON STRL SZ7.5 (GLOVE) ×2 IMPLANT
GOWN STRL REUS W/ TWL LRG LVL3 (GOWN DISPOSABLE) ×2 IMPLANT
GOWN STRL REUS W/ TWL XL LVL3 (GOWN DISPOSABLE) ×1 IMPLANT
GOWN STRL REUS W/TWL LRG LVL3 (GOWN DISPOSABLE) ×2
GOWN STRL REUS W/TWL XL LVL3 (GOWN DISPOSABLE) ×1
HANDPIECE INTERPULSE COAX TIP (DISPOSABLE)
IV NS IRRIG 3000ML ARTHROMATIC (IV SOLUTION) IMPLANT
KIT BASIN OR (CUSTOM PROCEDURE TRAY) ×2 IMPLANT
KIT TURNOVER KIT B (KITS) ×2 IMPLANT
NS IRRIG 1000ML POUR BTL (IV SOLUTION) ×2 IMPLANT
PACK CV ACCESS (CUSTOM PROCEDURE TRAY) IMPLANT
PACK GENERAL/GYN (CUSTOM PROCEDURE TRAY) ×2 IMPLANT
PACK UNIVERSAL I (CUSTOM PROCEDURE TRAY) ×2 IMPLANT
PAD ARMBOARD 7.5X6 YLW CONV (MISCELLANEOUS) ×4 IMPLANT
PULSAVAC PLUS IRRIG FAN TIP (DISPOSABLE)
SET HNDPC FAN SPRY TIP SCT (DISPOSABLE) IMPLANT
SPONGE GAUZE 2X2 STER 10/PKG (GAUZE/BANDAGES/DRESSINGS) ×1
SUT ETHILON 3 0 PS 1 (SUTURE) ×6 IMPLANT
SUT VIC AB 2-0 CTX 36 (SUTURE) IMPLANT
SUT VIC AB 3-0 SH 27 (SUTURE)
SUT VIC AB 3-0 SH 27X BRD (SUTURE) IMPLANT
SUT VICRYL 4-0 PS2 18IN ABS (SUTURE) IMPLANT
TIP FAN IRRIG PULSAVAC PLUS (DISPOSABLE) IMPLANT
TOWEL GREEN STERILE (TOWEL DISPOSABLE) ×2 IMPLANT
WATER STERILE IRR 1000ML POUR (IV SOLUTION) ×2 IMPLANT

## 2020-07-16 NOTE — Anesthesia Preprocedure Evaluation (Signed)
Anesthesia Evaluation  Patient identified by MRN, date of birth, ID band Patient awake    Reviewed: Allergy & Precautions, NPO status , Patient's Chart, lab work & pertinent test results  Airway Mallampati: II  TM Distance: >3 FB Neck ROM: Full    Dental no notable dental hx.    Pulmonary COPD, former smoker,    Pulmonary exam normal breath sounds clear to auscultation       Cardiovascular negative cardio ROS Normal cardiovascular exam Rhythm:Regular Rate:Normal     Neuro/Psych negative neurological ROS  negative psych ROS   GI/Hepatic negative GI ROS, Pancreatic cancer   Endo/Other  negative endocrine ROS  Renal/GU negative Renal ROS  negative genitourinary   Musculoskeletal negative musculoskeletal ROS (+)   Abdominal   Peds negative pediatric ROS (+)  Hematology negative hematology ROS (+)   Anesthesia Other Findings   Reproductive/Obstetrics negative OB ROS                             Anesthesia Physical Anesthesia Plan  ASA: III  Anesthesia Plan: General   Post-op Pain Management:    Induction: Intravenous  PONV Risk Score and Plan: 3 and Ondansetron, Dexamethasone and Treatment may vary due to age or medical condition  Airway Management Planned: LMA  Additional Equipment:   Intra-op Plan:   Post-operative Plan: Extubation in OR  Informed Consent: I have reviewed the patients History and Physical, chart, labs and discussed the procedure including the risks, benefits and alternatives for the proposed anesthesia with the patient or authorized representative who has indicated his/her understanding and acceptance.     Dental advisory given  Plan Discussed with: CRNA and Surgeon  Anesthesia Plan Comments:         Anesthesia Quick Evaluation

## 2020-07-16 NOTE — Anesthesia Procedure Notes (Signed)
Procedure Name: LMA Insertion Date/Time: 07/16/2020 12:38 PM Performed by: Myna Bright, CRNA Pre-anesthesia Checklist: Patient identified, Emergency Drugs available, Suction available and Patient being monitored Patient Re-evaluated:Patient Re-evaluated prior to induction Oxygen Delivery Method: Circle system utilized Preoxygenation: Pre-oxygenation with 100% oxygen Induction Type: IV induction Ventilation: Mask ventilation without difficulty LMA: LMA inserted LMA Size: 4.0 Number of attempts: 1 Placement Confirmation: positive ETCO2 and breath sounds checked- equal and bilateral Tube secured with: Tape Dental Injury: Teeth and Oropharynx as per pre-operative assessment

## 2020-07-16 NOTE — Op Note (Signed)
Procedure: Evacuation of hematoma right chest wall  Preoperative diagnosis: Hematoma right chest wall  Postoperative diagnosis: Same  Anesthesia: General  Assistant: Nurse  Operative findings: Hematoma right chest wall from former Port-A-Cath site  Indications: Patient is 69 year old female who had a Port-A-Cath removed a few weeks ago.  She developed a hematoma into the pocket area.  Operative details: After obtaining form consent, the patient taken the operating.  The patient placed supine position operating table.  After induction general anesthesia patient's right chest wall was prepped and draped in usual sterile fashion.  Local anesthesia in the form of 1% lidocaine and quarter percent Marcaine with epinephrine was infiltrated around the area of the pre-existing incision.  10 blade was then used to reopen the incision and the pocket for the Port-A-Cath was reentered.  A small amount of hematoma was removed.  This was approximately 5 cc of old blood.  There was no active bleeding.  The wound was thoroughly irrigated with normal saline solution.  The skin edges were fairly friable and beat up and I did not think a subcuticular stitch was going to hold well.  Therefore I closed this with interrupted simple and vertical mattress 3-0 nylon sutures.  Dry sterile dressing was applied.  The patient tired procedure well and there were no complications.  The instrument sponge needle counts correct in the case.  The patient was taken to recovery in stable condition.  Ruta Hinds, MD [facility] Office: [phone]

## 2020-07-16 NOTE — Anesthesia Postprocedure Evaluation (Signed)
Anesthesia Post Note  Patient: Beth Brock  Procedure(s) Performed: INCISION AND DRAINAGE RIGHT PORTA-CATH SITE; EVACUATION OF HEMATOMA (Right Chest)     Patient location during evaluation: PACU Anesthesia Type: General Level of consciousness: awake and alert Pain management: pain level controlled Vital Signs Assessment: post-procedure vital signs reviewed and stable Respiratory status: spontaneous breathing, nonlabored ventilation, respiratory function stable and patient connected to nasal cannula oxygen Cardiovascular status: blood pressure returned to baseline and stable Postop Assessment: no apparent nausea or vomiting Anesthetic complications: no   No complications documented.  Last Vitals:  Vitals:   07/16/20 1323 07/16/20 1338  BP: 123/69 118/73  Pulse: 79 76  Resp: 13 13  Temp:  36.7 C  SpO2: 97% 95%    Last Pain:  Vitals:   07/16/20 1338  TempSrc:   PainSc: 0-No pain                 Jessejames Steelman S

## 2020-07-16 NOTE — Transfer of Care (Signed)
Immediate Anesthesia Transfer of Care Note  Patient: Beth Brock  Procedure(s) Performed: INCISION AND DRAINAGE RIGHT PORTA-CATH SITE; EVACUATION OF HEMATOMA (Right Chest)  Patient Location: PACU  Anesthesia Type:General  Level of Consciousness: awake, alert , oriented and patient cooperative  Airway & Oxygen Therapy: Patient Spontanous Breathing and Patient connected to face mask oxygen  Post-op Assessment: Report given to RN, Post -op Vital signs reviewed and stable and Patient moving all extremities  Post vital signs: Reviewed and stable  Last Vitals:  Vitals Value Taken Time  BP 117/68 07/16/20 1308  Temp 36.7 C 07/16/20 1308  Pulse 86 07/16/20 1309  Resp 31 07/16/20 1309  SpO2 99 % 07/16/20 1309  Vitals shown include unvalidated device data.  Last Pain:  Vitals:   07/16/20 1308  TempSrc:   PainSc: (P) 0-No pain      Patients Stated Pain Goal: 3 (34/91/79 1505)  Complications: No complications documented.

## 2020-07-16 NOTE — Interval H&P Note (Signed)
History and Physical Interval Note:  07/16/2020 11:54 AM  Beth Brock  has presented today for surgery, with the diagnosis of [place].  The various methods of treatment have been discussed with the patient and family. After consideration of risks, benefits and other options for treatment, the patient has consented to  Procedure(s): [place] (Right) as a surgical intervention.  The patient's history has been reviewed, patient examined, no change in status, stable for surgery.  I have reviewed the patient's chart and labs.  Questions were answered to the patient's satisfaction.     Ruta Hinds

## 2020-07-16 NOTE — Telephone Encounter (Signed)
Left message with [facility] pharmacy to order lovenox. Order is in electronically per Dr. Oneida Alar.

## 2020-07-17 ENCOUNTER — Encounter (HOSPITAL_COMMUNITY): Payer: Self-pay | Admitting: Vascular Surgery

## 2020-07-29 ENCOUNTER — Ambulatory Visit (INDEPENDENT_AMBULATORY_CARE_PROVIDER_SITE_OTHER): Payer: Medicare HMO | Admitting: Vascular Surgery

## 2020-07-29 ENCOUNTER — Encounter: Payer: Self-pay | Admitting: Vascular Surgery

## 2020-07-29 ENCOUNTER — Other Ambulatory Visit: Payer: Self-pay

## 2020-07-29 VITALS — BP 120/81 | HR 102 | Temp 98.3°F | Resp 20 | Ht 67.0 in | Wt 123.0 lb

## 2020-07-29 DIAGNOSIS — I871 Compression of vein: Secondary | ICD-10-CM

## 2020-07-29 NOTE — Progress Notes (Signed)
Patient is a 69 year old female who returns for follow-up today.  She recently had removal of a Port-A-Cath from the right side for SVC syndrome.  She reports minimal facial or eyelid swelling at this point.  She does sleep with her head upright.  She thinks that overall the swelling symptoms have improved.  After removal of her Port-A-Cath she had a hematoma accumulate in the Port-A-Cath pocket site.  She recently had I&D of this.  She returns today for further postoperative follow-up.  She states that currently she is not on chemotherapy for her cancer.  She is still having some hematuria from her Lovenox.  Of note she also has recently diagnosed superior mesenteric vein thrombosis as well.   Physical exam:  Vitals:   07/29/20 1035  BP: 120/81  Pulse: (!) 102  Resp: 20  Temp: 98.3 F (36.8 C)  SpO2: 93%  Weight: 123 lb (55.8 kg)  Height: 5\' 7"  (1.702 m)    Right chest wall well-healed incision sutures removed today no accumulation of hematoma patient still has chest wall collaterals but these are less prominent  Assessment: Doing well status post removal of Port-A-Cath for SVC syndrome  Plan: Patient will follow up with me on as-needed basis.  Ruta Hinds, MD [facility] [place] Office: [phone]

## 2020-11-24 ENCOUNTER — Other Ambulatory Visit: Payer: Self-pay | Admitting: Internal Medicine

## 2020-11-24 DIAGNOSIS — R600 Localized edema: Secondary | ICD-10-CM

## 2020-11-25 ENCOUNTER — Ambulatory Visit
Admission: RE | Admit: 2020-11-25 | Discharge: 2020-11-25 | Disposition: A | Discharge status: Home / Self Care | Payer: Medicare HMO | Source: Ambulatory Visit | Attending: Internal Medicine | Admitting: Internal Medicine

## 2020-11-25 ENCOUNTER — Other Ambulatory Visit: Payer: Self-pay | Admitting: Internal Medicine

## 2020-11-25 DIAGNOSIS — R600 Localized edema: Secondary | ICD-10-CM

## 2020-11-26 ENCOUNTER — Ambulatory Visit
Admission: RE | Admit: 2020-11-26 | Discharge: 2020-11-26 | Disposition: A | Discharge status: Home / Self Care | Payer: Medicare HMO | Source: Ambulatory Visit | Attending: Internal Medicine | Admitting: Internal Medicine

## 2020-11-26 DIAGNOSIS — R600 Localized edema: Secondary | ICD-10-CM

## 2021-02-03 ENCOUNTER — Telehealth: Payer: Self-pay | Admitting: *Deleted

## 2021-02-03 ENCOUNTER — Other Ambulatory Visit: Payer: Self-pay | Admitting: *Deleted

## 2021-02-03 DIAGNOSIS — M7989 Other specified soft tissue disorders: Secondary | ICD-10-CM

## 2021-02-03 NOTE — Telephone Encounter (Signed)
Patient called and states she is having swelling in her left arm into the breast area. She had a port removed from that arm in April and she states she has a history of blood clots she is requesting an appt for evaluation. Patient scheduled for UE Venous duplex and an appt with Dr Donzetta Matters tomorrow.

## 2021-02-04 ENCOUNTER — Encounter: Payer: Self-pay | Admitting: Vascular Surgery

## 2021-02-04 ENCOUNTER — Ambulatory Visit: Payer: Medicare HMO | Admitting: Vascular Surgery

## 2021-02-04 ENCOUNTER — Other Ambulatory Visit: Payer: Self-pay

## 2021-02-04 ENCOUNTER — Ambulatory Visit (HOSPITAL_COMMUNITY)
Admission: RE | Admit: 2021-02-04 | Discharge: 2021-02-04 | Disposition: A | Discharge status: Home / Self Care | Payer: Medicare HMO | Source: Ambulatory Visit | Attending: Vascular Surgery | Admitting: Vascular Surgery

## 2021-02-04 VITALS — BP 100/64 | HR 75 | Temp 98.0°F | Resp 16 | Ht 67.0 in | Wt 116.6 lb

## 2021-02-04 DIAGNOSIS — M7989 Other specified soft tissue disorders: Secondary | ICD-10-CM | POA: Diagnosis not present

## 2021-02-04 DIAGNOSIS — I871 Compression of vein: Secondary | ICD-10-CM | POA: Diagnosis not present

## 2021-02-04 NOTE — Progress Notes (Signed)
Patient ID: Beth Brock, female   DOB: 1951/06/16, 70 y.o.   MRN: [id number]  Reason for Consult: Follow-up   Referred by Audley Hose, MD  Subjective:     HPI:  Beth Brock is a 70 y.o. female history of upper extremity DVT and pulmonary embolism.  She had a port removed from her right IJ subsequent hematoma evacuation.  She now had a port in the left arm this was removed at [facility].  She has persistent swelling of her left arm and breast.  She does not have any ulceration.  She has tried elevating the arm to with some relief.  She is on Lovenox daily.  She states that her pain is mild but mostly she is concerned with the heaviness.  Past Medical History:  Diagnosis Date  . Cancer [facility])    Pancreatic   . COPD (chronic obstructive pulmonary disease) ([place])   . Pulmonary embolism ([facility])    Family History  Problem Relation Age of Onset  . Breast cancer Mother 17  . Breast cancer Paternal Grandmother 46   Past Surgical History:  Procedure Laterality Date  . BREAST EXCISIONAL BIOPSY Left   . BREAST EXCISIONAL BIOPSY Left   . BREAST EXCISIONAL BIOPSY Right   . CYSTOSCOPY W/ URETERAL STENT PLACEMENT Left 06/17/2020   Procedure: CYSTOSCOPY WITHLEFT  RETROGRADE PYELOGRAM AND LEFT DOUBLE J  STENT PLACEMENT;  Surgeon: Franchot Gallo, MD;  Location: [place];  Service: Urology;  Laterality: Left;  . ESOPHAGOGASTRODUODENOSCOPY N/A 08/23/2018   Procedure: ESOPHAGOGASTRODUODENOSCOPY (EGD);  Surgeon: Carol Ada, MD;  Location: [facility];  Service: Endoscopy;  Laterality: N/A;  . INCISION AND DRAINAGE Right 07/16/2020   Procedure: INCISION AND DRAINAGE RIGHT PORTA-CATH SITE; EVACUATION OF HEMATOMA;  Surgeon: Elam Dutch, MD;  Location: [place];  Service: Vascular;  Laterality: Right;  . PORT-A-CATH REMOVAL Right 06/18/2020   Procedure: REMOVAL PORT-A-CATH;  Surgeon: Elam Dutch, MD;  Location: [facility] OR;  Service: Vascular;  Laterality: Right;  . UPPER ESOPHAGEAL  ENDOSCOPIC ULTRASOUND (EUS) N/A 08/23/2018   Procedure: UPPER ESOPHAGEAL ENDOSCOPIC ULTRASOUND (EUS);  Surgeon: Carol Ada, MD;  Location: [facility];  Service: Endoscopy;  Laterality: N/A;    Short Social History:  Social History   Tobacco Use  . Smoking status: Former Smoker    Quit date: 08/23/2005    Years since quitting: 15.4  . Smokeless tobacco: Never Used  Substance Use Topics  . Alcohol use: Yes    Comment: wine with dinner    Allergies  Allergen Reactions  . Iodinated Diagnostic Agents Itching and Shortness Of Breath    Per the patient ok to remove Patient experienced tachycardia, low blood pressure, low oxygen saturation following administration of contrast.  Patient also stated she had tingling in her extremities and tongue.  Patient was administered Benadryl and saline with improvement in symptoms.    Current Outpatient Medications  Medication Sig Dispense Refill  . acetaminophen (TYLENOL) 500 MG tablet Take 500 mg by mouth every 6 (six) hours as needed.    Marland Kitchen albuterol (VENTOLIN HFA) 108 (90 Base) MCG/ACT inhaler Inhale 1-2 puffs into the lungs daily as needed for wheezing or shortness of breath.     Marland Kitchen alendronate (FOSAMAX) 70 MG tablet Take 70 mg by mouth once a week.    . Ascorbic Acid (VITAMIN C PO) Take 2,000 mg by mouth daily.     Marland Kitchen b complex vitamins capsule Take 1 capsule by mouth daily.    Marland Kitchen  Calcium Carb-Cholecalciferol (CALCIUM+D3) 600-800 MG-UNIT TABS Take 1 tablet by mouth daily.    Marland Kitchen CALCIUM PO Take 1 tablet by mouth daily.    . Cholecalciferol (VITAMIN D3) 125 MCG (5000 UT) TABS Take 5,000 Units by mouth daily.     Marland Kitchen CREON 24000-76000 units CPEP Take 2-3 capsules by mouth 3 (three) times daily with meals.     . Noni, Morinda citrifolia, (NONI JUICE PO) Take 30 mLs by mouth once a week.     Marland Kitchen omeprazole (PRILOSEC) 40 MG capsule Take 40 mg by mouth as needed (acid reflux).     Vladimir Faster Glycol-Propyl Glycol 0.4-0.3 % SOLN Place 2 drops into both eyes  daily as needed (for dry/irritated eyes.).     Marland Kitchen simethicone (MYLICON) 80 MG chewable tablet Chew by mouth.    Marland Kitchen VITAMIN A PO Take 3,000 mcg by mouth once a week.    . enoxaparin (LOVENOX) 80 MG/0.8ML injection SMARTSIG:0.8 Milliliter(s) SUB-Q Every 12 Hours    . oxyCODONE-acetaminophen (PERCOCET) 10-325 MG tablet Take 1 tablet by mouth every 6 (six) hours as needed for pain. (Patient not taking: Reported on 02/04/2021) 10 tablet 0  . tamsulosin (FLOMAX) 0.4 MG CAPS capsule tamsulosin 0.4 mg capsule     No current facility-administered medications for this visit.    Review of Systems  Constitutional:  Constitutional negative. HENT: HENT negative.  Eyes: Eyes negative.  Respiratory: Respiratory negative.  Cardiovascular: Cardiovascular negative.  GI: Gastrointestinal negative.  Musculoskeletal:       Swelling left arm and breast Skin: Skin negative.  Neurological: Neurological negative. Hematologic: Hematologic/lymphatic negative.  Psychiatric: Psychiatric negative.        Objective:  Objective   Vitals:   02/04/21 1450  BP: 100/64  Pulse: 75  Resp: 16  Temp: 98 F (36.7 C)  SpO2: 96%  Weight: 116 lb 9.6 oz (52.9 kg)  Height: 5\' 7"  (1.702 m)   Body mass index is 18.26 kg/m.  Physical Exam HENT:     Head: Normocephalic.     Nose:     Comments: Wearing a mask Eyes:     Pupils: Pupils are equal, round, and reactive to light.  Neck:     Comments: Prominent neck and chest collateral veins Cardiovascular:     Rate and Rhythm: Normal rate.  Pulmonary:     Effort: Pulmonary effort is normal.  Abdominal:     General: Abdomen is flat.     Palpations: Abdomen is soft.  Musculoskeletal:     Comments: Left upper extremity pitting edema  Skin:    Capillary Refill: Capillary refill takes less than 2 seconds.  Neurological:     General: No focal deficit present.     Mental Status: She is alert.  Psychiatric:        Mood and Affect: Mood normal.        Behavior:  Behavior normal.        Thought Content: Thought content normal.        Judgment: Judgment normal.     Data: Left Findings:  +--------+------------+---------+-----------+----------------+-------+  LEFT  CompressiblePhasicitySpontaneous  Properties  Summary  +--------+------------+---------+-----------+----------------+-------+  IJV    Partial             softly echogenic      +--------+------------+---------+-----------+----------------+-------+  Axillary Partial                           +--------+------------+---------+-----------+----------------+-------+  Brachial  Full                           +--------+------------+---------+-----------+----------------+-------+  Radial   Full                           +--------+------------+---------+-----------+----------------+-------+  Ulnar    Full                           +--------+------------+---------+-----------+----------------+-------+  Cephalic  Full                           +--------+------------+---------+-----------+----------------+-------+  Basilic   Full                           +--------+------------+---------+-----------+----------------+-------+   Mixed echogenicity structure present in the mid upper arm measuring  approximately 2.5 x 3.5 cm in diameter.    Summary:    Left:  Findings consistent with age indeterminate deep vein thrombosis involving  the  left internal jugular vein and proximal left subclavian vein. Slow  rouleaux  venous flow is observed throughout the upper extremity.        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  I have offered her venogram from a left upper extremity and left common femoral venous approach with limited expectancy of success.  At this time patient would like no further procedures.  She can see me on an as-needed basis.     Waynetta Sandy MD [facility]

## 2021-03-18 DEATH — deceased

## 2022-06-13 IMAGING — US US EXTREM LOW VENOUS
1 series · 13 of 24 positions shown · non-contrast
Comparison: None.

CLINICAL DATA: Soft tissue it

EXAM:
BILATERAL LOWER EXTREMITY VENOUS DUPLEX ULTRASOUND
TECHNIQUE: Gray-scale sonography with graded compression, as well as color
Doppler and duplex ultrasound were performed to evaluate the lower
extremity deep venous systems from the level of the common femoral
vein and including the common femoral, femoral, profunda femoral,
popliteal and calf veins including the posterior tibial, peroneal
and gastrocnemius veins when visible. The superficial great
saphenous vein was also interrogated. Spectral Doppler was utilized
to evaluate flow at rest and with distal augmentation maneuvers in
the common femoral, femoral and popliteal veins.

[Series 1: us extrem low venous · 0.06mm/px · 13 of 63 slices shown]
[im 1/63]
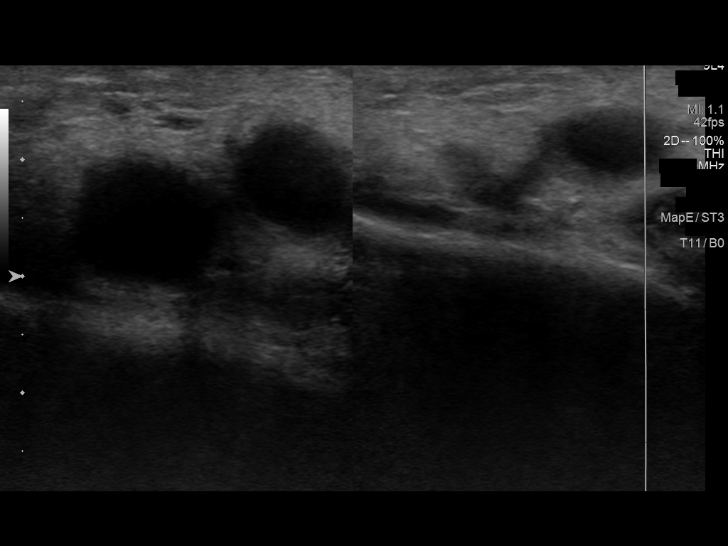
[im 6/63]
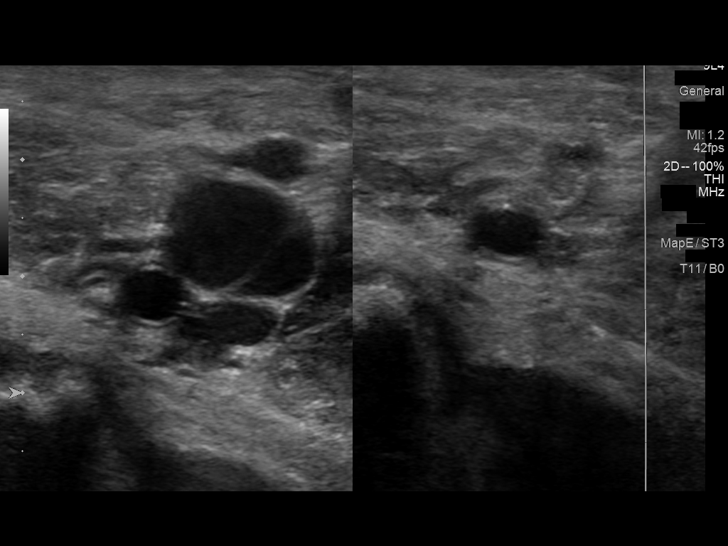
[im 11/63]
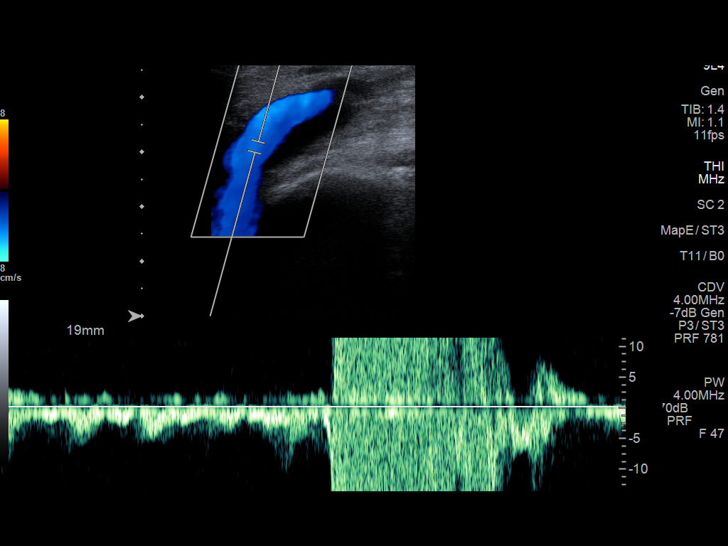
[im 17/63]
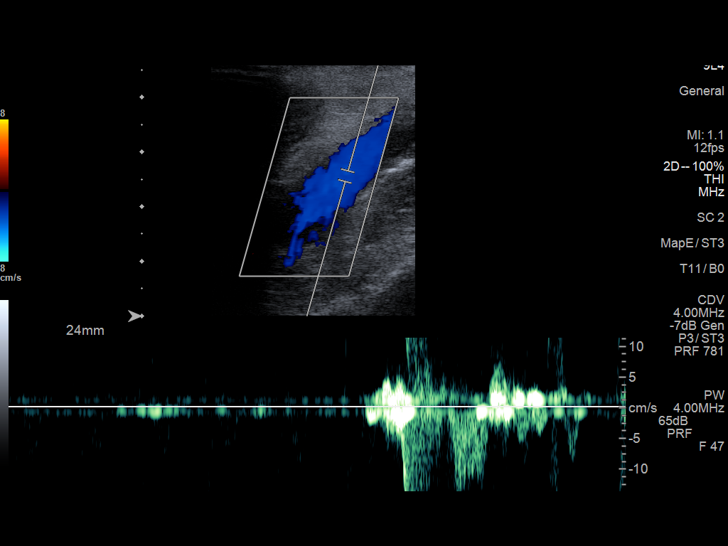
[im 22/63]
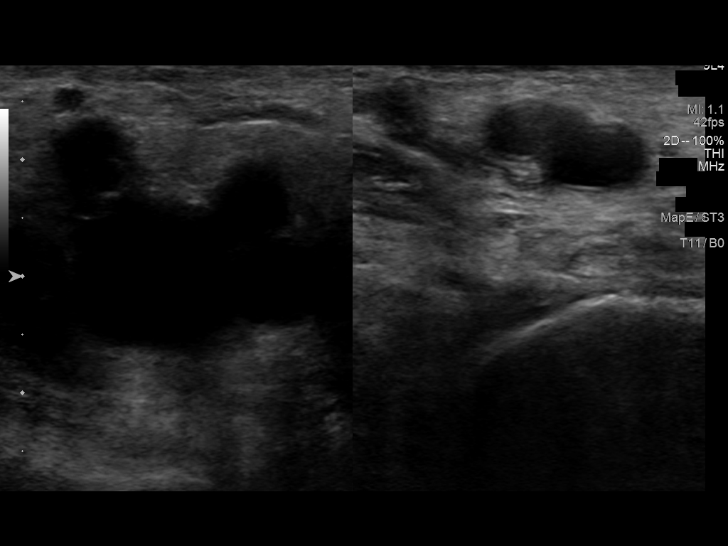
[im 27/63]
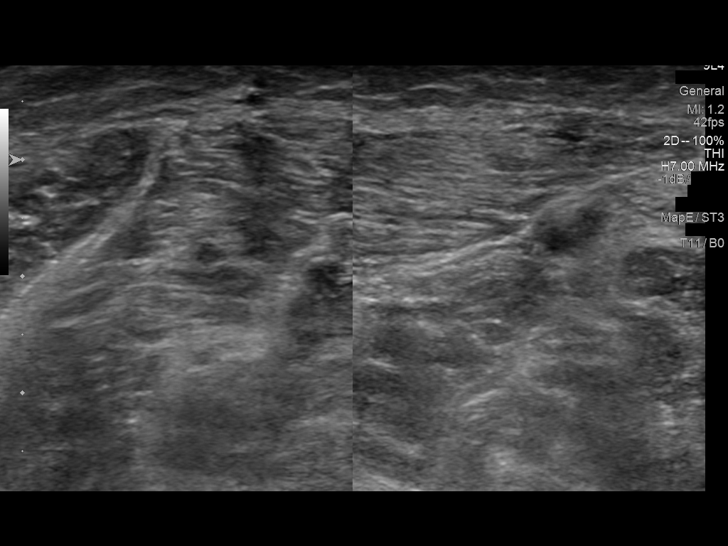
[im 33/63]
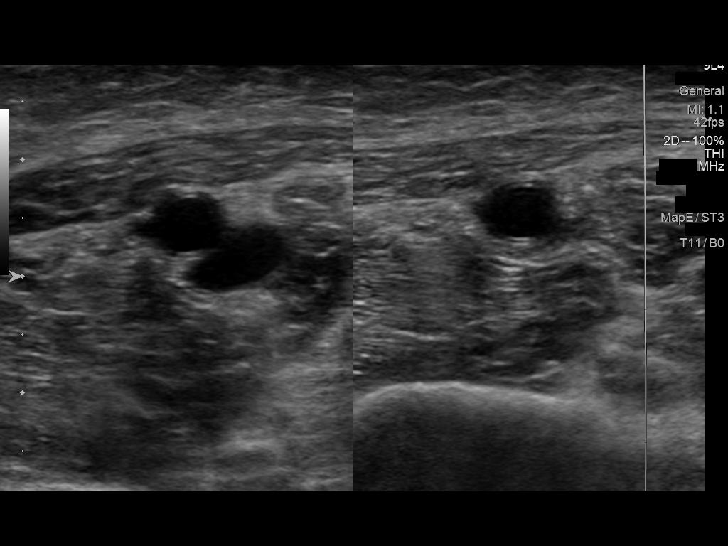
[im 36/63]
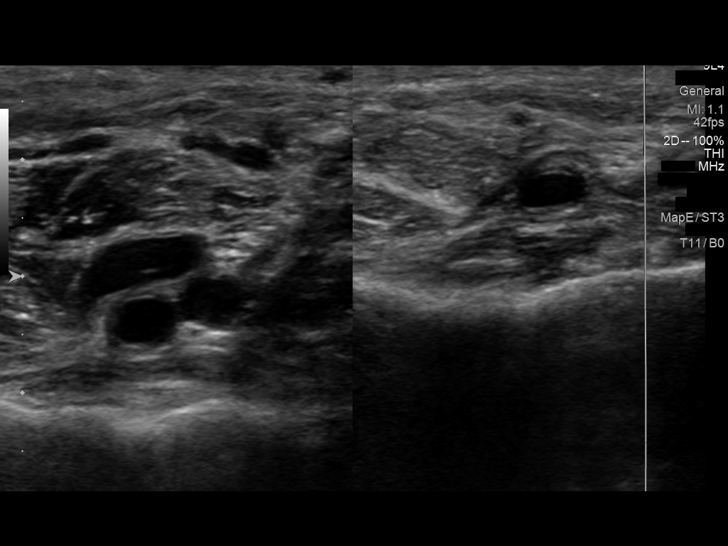
[im 41/63]
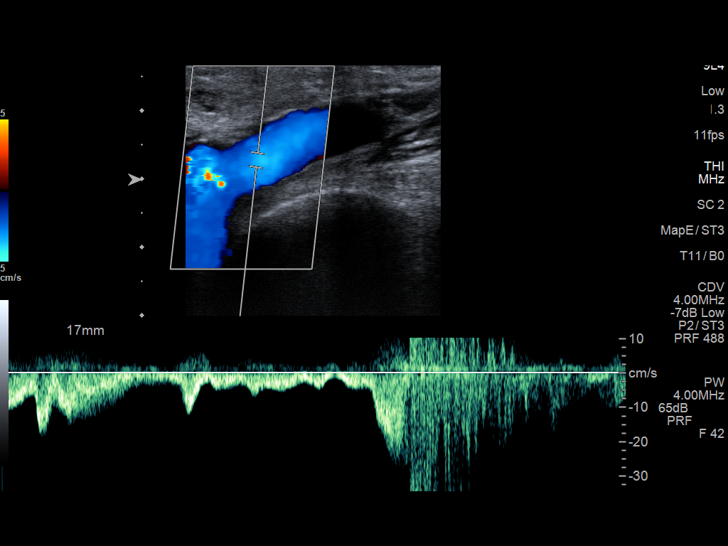
[im 46/63]
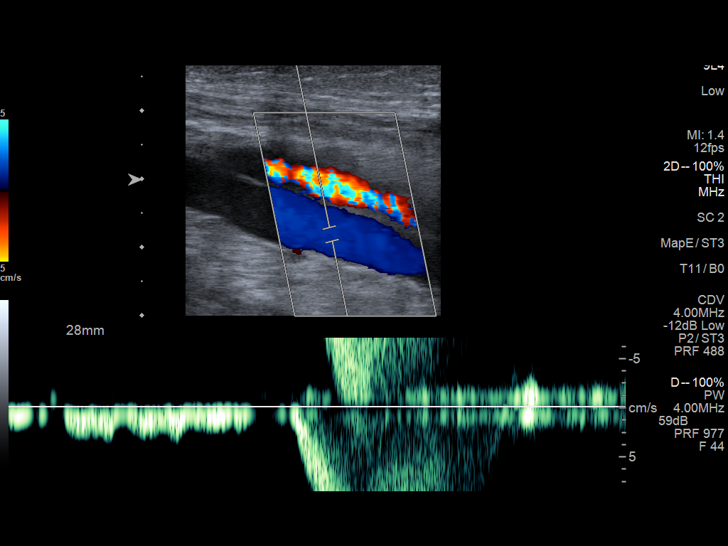
[im 52/63]
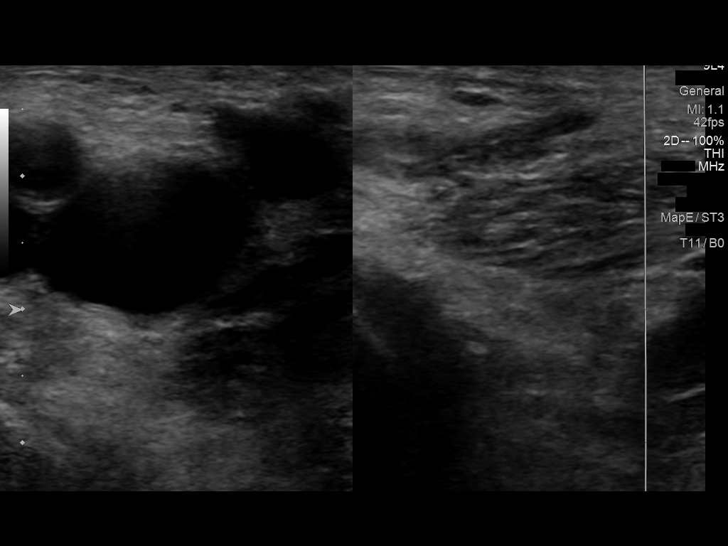
[im 57/63]
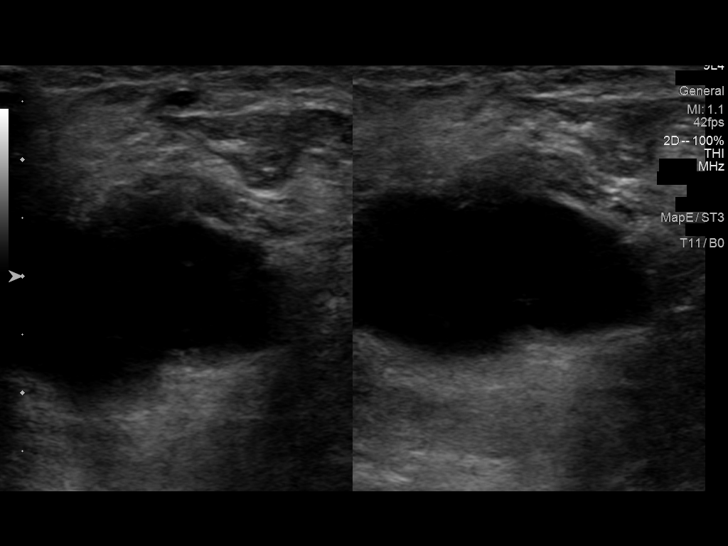
[im 63/63]
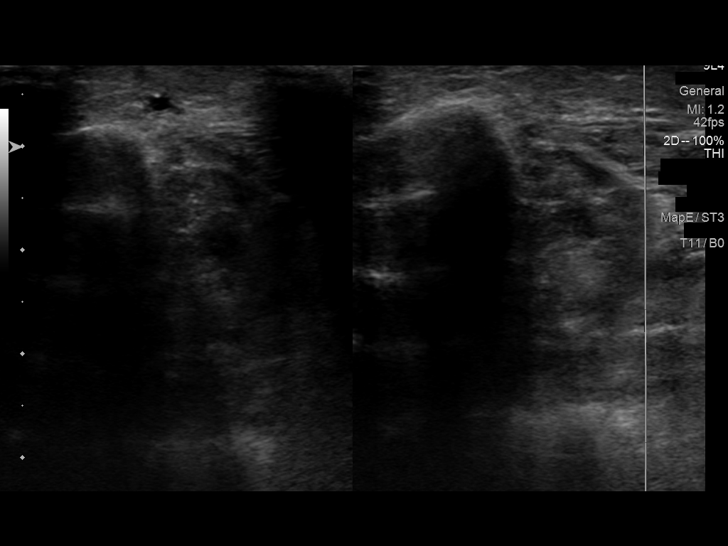

[13 of 24 positions shown; findings below may reference images not displayed]

FINDINGS: RIGHT LOWER EXTREMITY

Common Femoral Vein: No evidence of thrombus. Normal
compressibility, respiratory phasicity and response to augmentation.

Saphenofemoral Junction: No evidence of thrombus. Normal
compressibility and flow on color Doppler imaging.

Profunda Femoral Vein: No evidence of thrombus. Normal
compressibility and flow on color Doppler imaging.

Femoral Vein: No evidence of thrombus. Normal compressibility,
respiratory phasicity and response to augmentation.

Popliteal Vein: No evidence of thrombus. Normal compressibility,
respiratory phasicity and response to augmentation.

Calf Veins: No evidence of thrombus. Normal compressibility and flow
on color Doppler imaging.

Superficial Great Saphenous Vein: No evidence of thrombus. Normal
compressibility.

Venous Reflux:  None.

Other Findings: There is a mildly complex fluid collection along the
medial popliteal fossa region measuring 3.4 x 1.1 x 1.5 cm.

LEFT LOWER EXTREMITY

Common Femoral Vein: No evidence of thrombus. Normal
compressibility, respiratory phasicity and response to augmentation.

Saphenofemoral Junction: No evidence of thrombus. Normal
compressibility and flow on color Doppler imaging.

Profunda Femoral Vein: No evidence of thrombus. Normal
compressibility and flow on color Doppler imaging.

Femoral Vein: No evidence of thrombus. Normal compressibility,
respiratory phasicity and response to augmentation.

Popliteal Vein: No evidence of thrombus. Normal compressibility,
respiratory phasicity and response to augmentation.

Calf Veins: No evidence of thrombus. Normal compressibility and flow
on color Doppler imaging.

Superficial Great Saphenous Vein: No evidence of thrombus. Normal
compressibility.

Venous Reflux:  None.

Other Findings:  None.
IMPRESSION: No evidence of deep venous thrombosis in either lower extremity.
Suspected dissected Baker cyst right medial popliteal fossa region.

## 2022-06-14 IMAGING — US US EXTREM  UP VENOUS*L*
1 series · 13 of 24 positions shown · non-contrast
Comparison: 06/15/2020 chest CT.

CLINICAL DATA: Left breast swelling.  History of pancreatic cancer.



[Series 1: us extrem up venous*left* · 0.07mm/px · 13 of 99 slices shown]
[im 1/99]
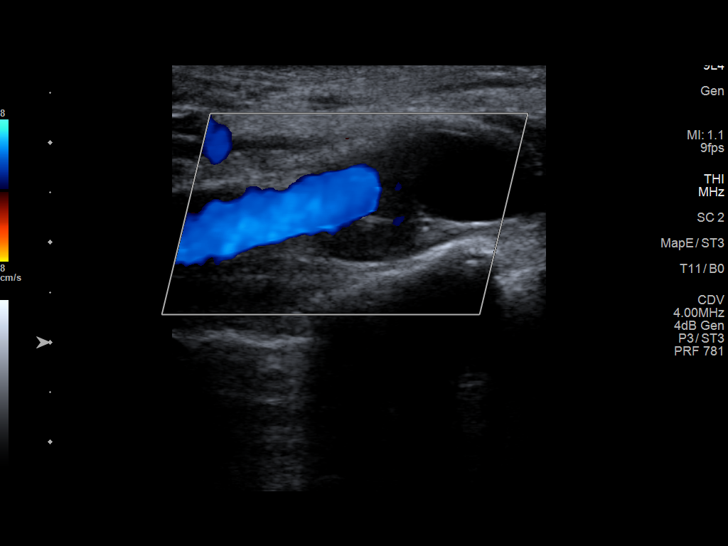
[im 9/99]
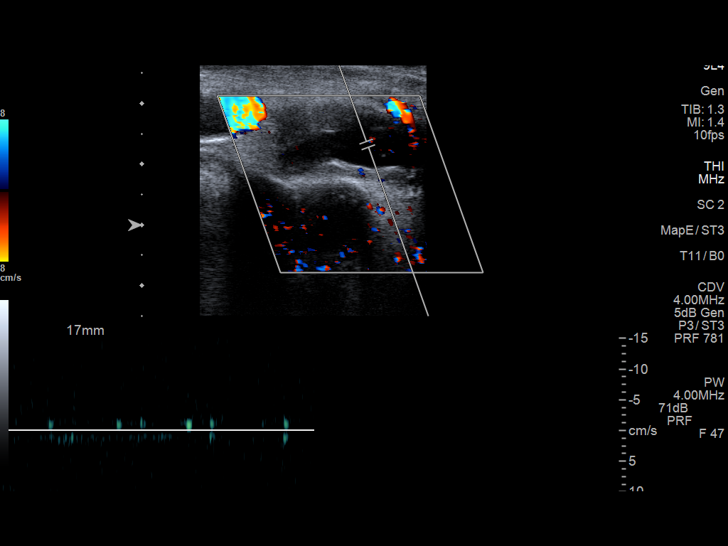
[im 18/99]
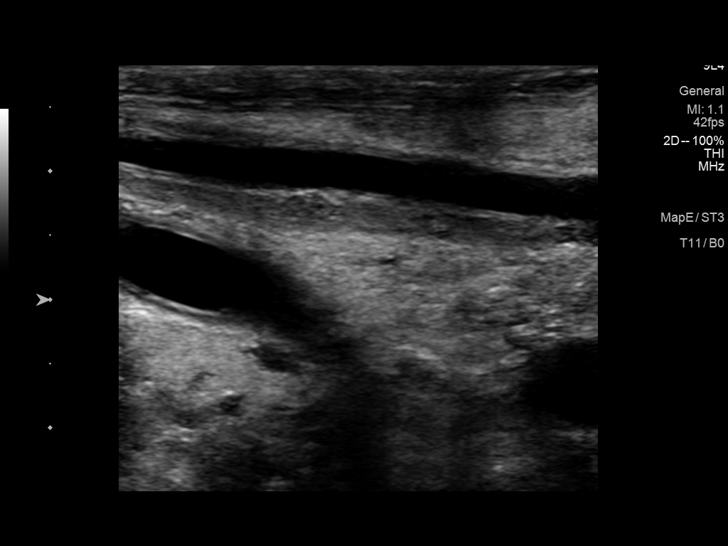
[im 26/99]
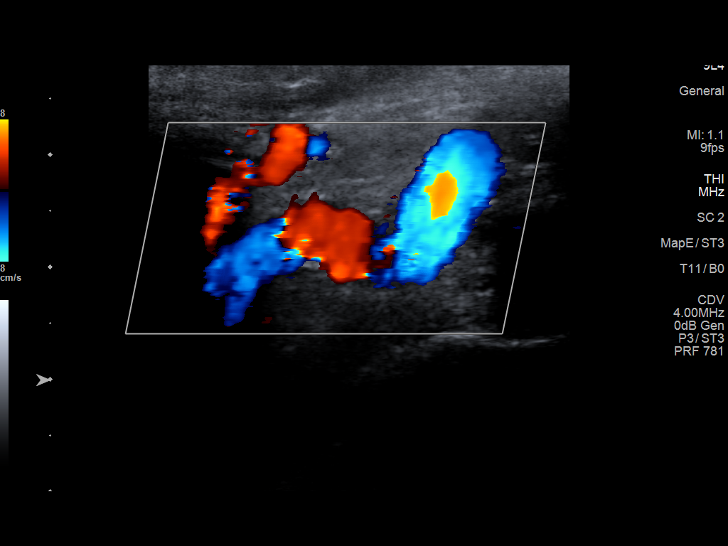
[im 35/99]
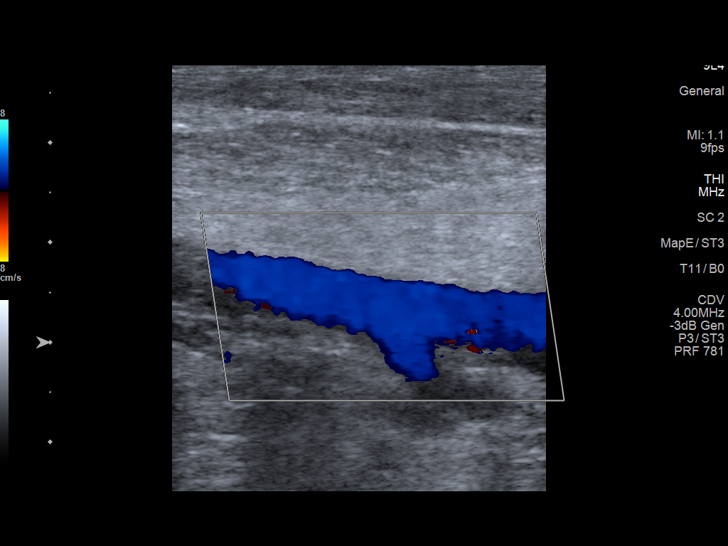
[im 43/99]
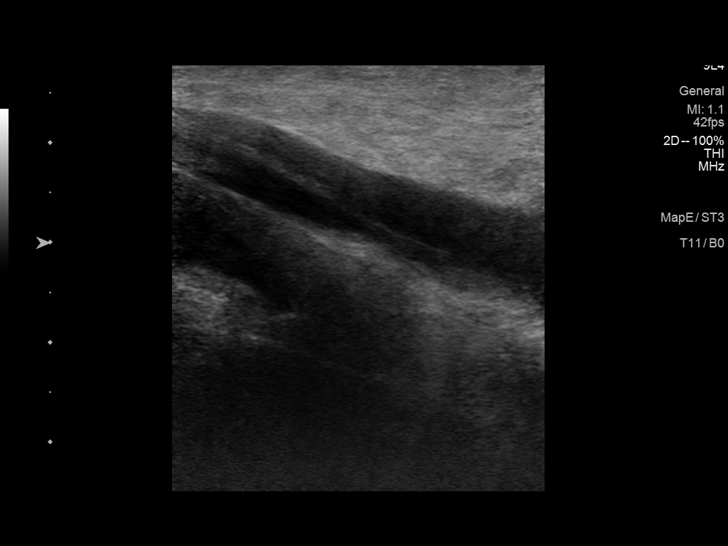
[im 52/99]
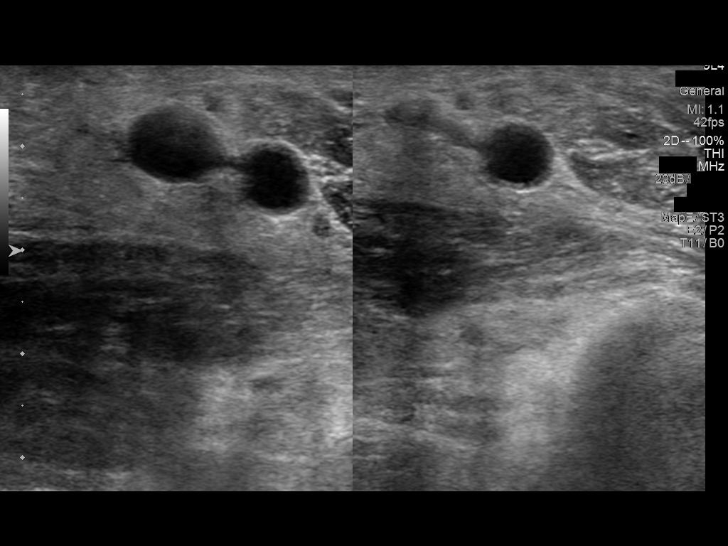
[im 56/99]
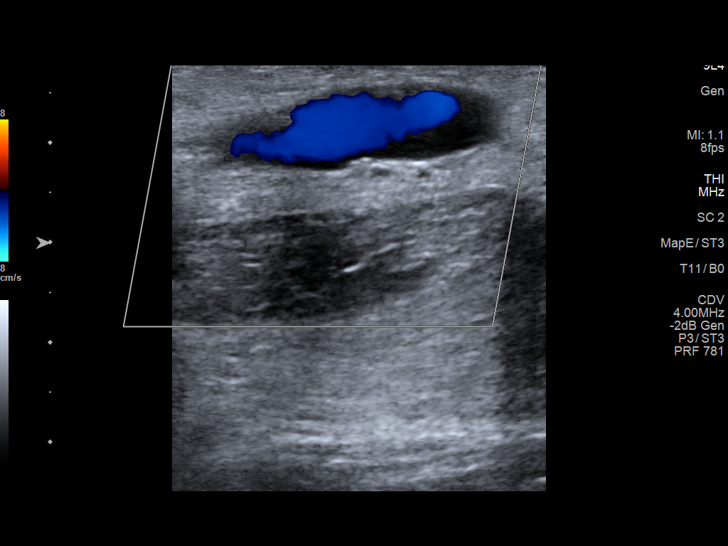
[im 64/99]
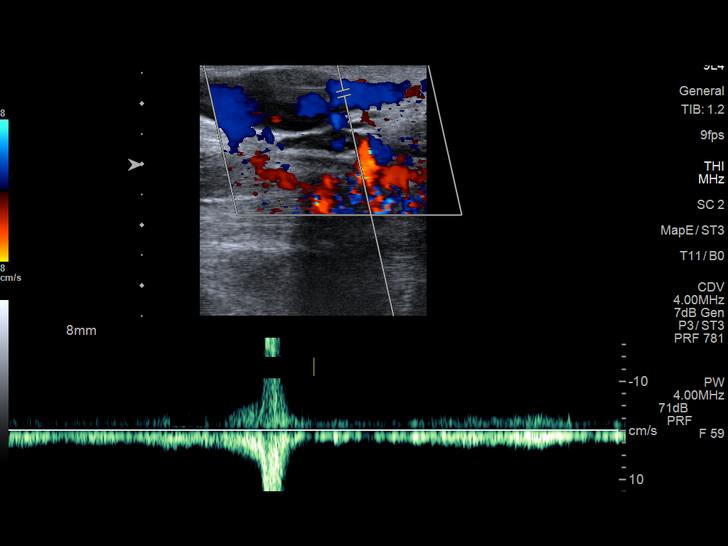
[im 73/99]
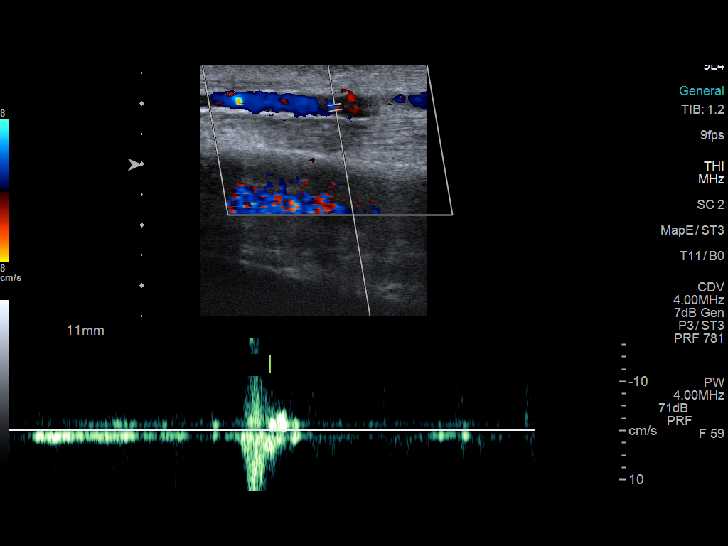
[im 81/99]
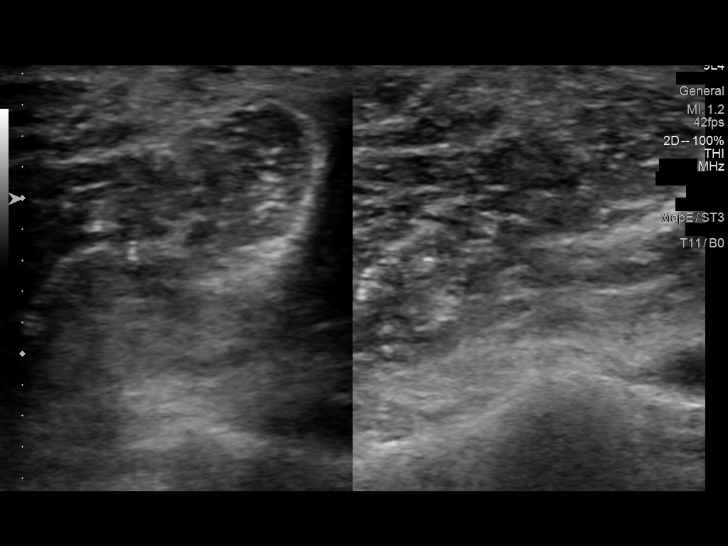
[im 90/99]
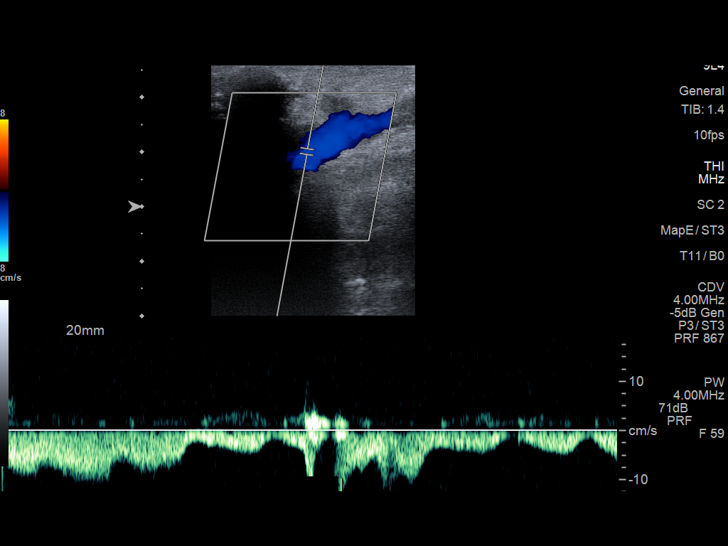
[im 99/99]
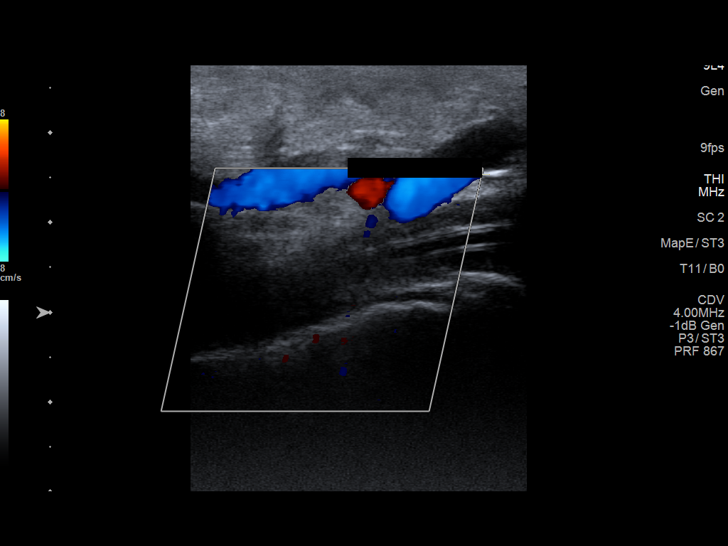

[13 of 24 positions shown; findings below may reference images not displayed]

FINDINGS: Contralateral Subclavian Vein: Thrombus identified in the proximal
right subclavian vein, near occlusive versus occlusive.

Internal Jugular Vein: No evidence of thrombus. Normal
compressibility, respiratory phasicity and response to augmentation.

Subclavian Vein: Catheter seen in the left subclavian vein.
Suggestion of pericatheter occlusive thrombus in the proximal left
subclavian vein, noting limited visualization in this location. Mid
and distal left subclavian vein appear patent and without thrombus.
Prominent patent venous collateral identified superior to the left
subclavian vein.

Axillary Vein: No evidence of thrombus. Normal compressibility,
respiratory phasicity and response to augmentation.

Cephalic Vein: No evidence of thrombus. Normal compressibility,
respiratory phasicity and response to augmentation.

Basilic Vein: No evidence of thrombus. Normal compressibility,
respiratory phasicity and response to augmentation.

Brachial Veins: Small nonocclusive thrombus in the proximal left
brachial vein adjacent to the catheter. Otherwise patent left
brachial veins.

Radial Veins: No evidence of thrombus. Normal compressibility,
respiratory phasicity and response to augmentation.

Ulnar Veins: No evidence of thrombus. Normal compressibility,
respiratory phasicity and response to augmentation.

Venous Reflux:  None visualized.

Other Findings:  None visualized.
IMPRESSION: 1. Suggestion of pericatheter occlusive thrombus in the proximal
left subclavian vein, noting limited visualization in this location.
Prominent patent venous collateral identified superior to the left
subclavian vein.
2. Small nonocclusive thrombus in the proximal left brachial vein
adjacent to the catheter.
3. Near occlusive versus occlusive thrombus in the proximal right
subclavian vein.

These results will be called to the ordering clinician or
representative by the [HOSPITAL] at the imaging location.
